# Patient Record
Sex: Male | Born: 1950 | Race: White | Hispanic: No | Marital: Married | Smoking: Never smoker
Health system: Southern US, Community
[De-identification: ages and names within clinical notes are randomized; demographics above are authoritative.]

## PROBLEM LIST (undated history)

## (undated) DIAGNOSIS — K7460 Unspecified cirrhosis of liver: Secondary | ICD-10-CM

## (undated) DIAGNOSIS — F419 Anxiety disorder, unspecified: Secondary | ICD-10-CM

---

## 2007-11-28 ENCOUNTER — Emergency Department (HOSPITAL_BASED_OUTPATIENT_CLINIC_OR_DEPARTMENT_OTHER): Admission: EM | Admit: 2007-11-28 | Discharge: 2007-11-28 | Payer: Self-pay | Admitting: Emergency Medicine

## 2017-12-16 ENCOUNTER — Other Ambulatory Visit: Payer: Self-pay | Admitting: Gastroenterology

## 2017-12-16 DIAGNOSIS — K7031 Alcoholic cirrhosis of liver with ascites: Secondary | ICD-10-CM

## 2017-12-17 ENCOUNTER — Ambulatory Visit
Admission: RE | Admit: 2017-12-17 | Discharge: 2017-12-17 | Disposition: A | Discharge status: Home / Self Care | Payer: Self-pay | Source: Ambulatory Visit | Attending: Gastroenterology | Admitting: Gastroenterology

## 2017-12-17 ENCOUNTER — Other Ambulatory Visit: Payer: Self-pay | Admitting: *Deleted

## 2017-12-17 DIAGNOSIS — K7031 Alcoholic cirrhosis of liver with ascites: Secondary | ICD-10-CM

## 2017-12-17 HISTORY — PX: IR RADIOLOGIST EVAL & MGMT: IMG5224

## 2017-12-17 NOTE — Consult Note (Signed)
Chief Complaint: Patient was seen in consultation today for  Chief Complaint  Patient presents with  . Consult    Consult for TIPS    at the request of Badreddine,Rami  Referring Physician(s): Badreddine,Rami  History of Present Illness: Jimmy Sanford is a 67 y.o. 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  In April he was being evaluated for increasing abdominal girth over the previous 6 months.  CT demonstrated ascites and morphologic changes of liver cirrhosis.  He does have a multiyear history of alcohol heavy use.  He also complained at that time of mild bilateral lower extremity swelling abdominal pressure.  No melena, hematochezia, jaundice, weight loss, fever or chills.  Since then, he is undergone 6 large-volume ultrasound-guided paracentesis procedures for recurrent abdominal ascites.Endoscopy 09/07/17 showed no varices.    PAST MEDICAL HISTORY:    Patient Active Problem List  Diagnosis  . Allergic rhinitis  . Anxiety  . Benign essential hypertension  . Gastroesophageal reflux disease  . Testicular hypofunction  . Mild alcohol use disorder  . Impaired fasting glucose  . Tendinopathy of right rotator cuff  . Acute pain of right shoulder  . Hypokalemia  . Tendinopathy of left rotator cuff  . Bicipital tendinitis of left shoulder  . Organic impotence  . DOE (dyspnea on exertion)  . Other fatigue  . Decrease in appetite  . Acute kidney failure ([place])  . Abdominal distension  . Anemia    PAST SURGICAL HISTORY: PastSurgicalHistory  Past Surgical History:  Procedure Laterality Date  . ANKLE FRACTURE SURGERY          Allergies: NKDA  Medications: Prior to Admission medications   Medication Sig Start Date End Date Taking? Authorizing Provider  citalopram (CELEXA) 20 MG tablet Take 20 mg by mouth daily.   Yes  [provider]  ergocalciferol (VITAMIN D2) 50000 units capsule Take 50,000 Units by mouth once a week.   Yes [provider]  folic acid (FOLVITE) 1 MG tablet Take 1 mg by mouth daily.   Yes [provider]  lactulose (CHRONULAC) 10 GM/15ML solution Take by mouth daily.   Yes [provider]  milk thistle 175 MG tablet Take 175 mg by mouth daily.   Yes [provider]  pantoprazole (PROTONIX) 40 MG tablet Take 40 mg by mouth daily.   Yes [provider]  sildenafil (REVATIO) 20 MG tablet Take 20 mg by mouth 3 (three) times daily.   Yes [provider]  spironolactone (ALDACTONE) 50 MG tablet Take 50 mg by mouth daily.   Yes [provider]  Testosterone 30 MG/ACT SOLN Place 1 Pump onto the skin daily.   Yes [provider]  traZODone (DESYREL) 50 MG tablet Take 50 mg by mouth at bedtime.   Yes [provider]       FAMILY HISTORY: Negative for colon cancer, colon polyps, ulcerative colitis, Crohn's disease, liver disease.   Social History   Socioeconomic History  . Marital status: Married    Spouse name: Not on file  . Number of children: Not on file  . Years of education: Not on file  . Highest education level: Not on file  Occupational History  . Not on file  Social Needs  . Financial resource strain: Not on file  . Food insecurity:    Worry: Not on file  Inability: Not on file  . Transportation needs:    Medical: Not on file    Non-medical: Not on file  Tobacco Use  . Smoking status: Never Smoker  . Smokeless tobacco: Former Systems developer    Types: Chew  Substance and Sexual Activity  . Alcohol use: Not Currently  . Drug use: Never  . Sexual activity: Not on file  Lifestyle  . Physical activity:    Days per week: Not on file    Minutes per session: Not on file  . Stress: Not on file  Relationships  . Social connections:    Talks on phone: Not on file    Gets together: Not on  file    Attends religious service: Not on file    Active member of club or organization: Not on file    Attends meetings of clubs or organizations: Not on file    Relationship status: Not on file  Other Topics Concern  . Not on file  Social History Narrative  . Not on file    ECOG Status: 1 - Symptomatic but completely ambulatory   Review of Systems  Review of Systems: A 12 point ROS discussed and pertinent positives are indicated in the HPI above.  All other systems are negative.  Physical Exam Vital Signs: BP 133/88   Pulse 78   Temp 98 F (36.7 C) (Oral)   Resp 15   Ht 6' (1.829 m)   Wt 93 kg   SpO2 98%   BMI 27.80 kg/m   Constitutional: Oriented to person, place, and time. Well-developed and well-nourished. No distress.  Last Weight  Most recent update: 12/17/2017  2:17 PM   Weight  93 kg (205 lb)           HENT:  Head: Normocephalic and atraumatic.  Eyes: Conjunctivae and EOM are normal. Right eye exhibits no discharge. Left eye exhibits no discharge. No scleral icterus.  Neck: No JVD present.  Pulmonary/Chest: Effort normal. No stridor. No respiratory distress.  Abdomen: soft, non distended Neurological:  alert and oriented to person, place, and time.  Skin: Skin is warm and dry.  not diaphoretic.  Psychiatric:   normal mood and affect.   behavior is normal. Judgment and thought content normal.        Imaging:  CT CHEST, ABDOMEN AND PELVIS WITHOUT CONTRAST  TECHNIQUE: Multidetector CT imaging of the chest, abdomen and pelvis was performed following the standard protocol without IV contrast. The patient could not have IV contrast due to renal insufficiency.  COMPARISON:  Radiographs from 08/24/2017  FINDINGS: CT CHEST FINDINGS  Cardiovascular: Mild atherosclerotic calcification at the origin of the left subclavian artery.  Mediastinum/Nodes: Dilated esophagus is contrast filled, query dysmotility or reflux. Minimal distal esophageal wall  thickening. No pathologic adenopathy is observed.  Lungs/Pleura: Elevated right hemidiaphragm with atelectasis primarily involving the right middle lobe accounting for much of the right basilar irregularity. There is also some atelectasis in the right lower lobe, as shown on image 56/4. No appreciable airway obstructing process. No pulmonary abscess or compelling findings of pneumonia.  5 by 3 mm calcified nodule in the left lower lobe compatible with granuloma, image 77/3.  Musculoskeletal: Lower thoracic spondylosis. Mild bilateral gynecomastia.  CT ABDOMEN PELVIS FINDINGS  Hepatobiliary: Somewhat small liver, equivocal marginal irregularity, correlate with history of cirrhosis. No appreciable gallstones.  Pancreas: Unremarkable  Spleen: The spleen measures 13.2 by 7.0 by 15.9 cm (volume = 770 cm^3). There are some linear calcifications along the  upper splenic capsule.  Adrenals/Urinary Tract: Adrenal glands normal. 2.7 by 2.8 cm right kidney upper pole hypodense lesion, fluid density. 1.6 by 1.7 cm right kidney lower pole hypodense lesion, fluid density. 1.0 cm hypodense lesion of the left kidney upper pole. Small exophytic lesion of the left kidney upper pole is hyperdense and probably a cyst but technically nonspecific. No stones identified.  Stomach/Bowel: No dilated bowel identified. Fat deposition in the wall of the ascending colon which can be weakly associated with inflammatory bowel disease but which is more commonly simply an incidental finding. No significant amount of stool in the colon. Scattered sigmoid colon diverticula. Jejunal folds normal, no abnormal small bowel dilatation. Appendix not well seen.  Vascular/Lymphatic: Aortoiliac atherosclerotic vascular disease. No pathologic adenopathy observed  Reproductive: Curvilinear calcifications centrally along the prostate gland.  Other: Marked ascites. Fluid infiltration of the omentum  and mesentery.  Musculoskeletal: Umbilical hernia contains ascites and measures about 5 cm in diameter. Subcutaneous edema along the anterior abdominal wall and to a lesser degree overlying the hips and pubis. Fatty left spermatic cord. Bridging spurring of the sacroiliac joints. Degenerative facet arthropathy bilaterally at L4-5 and L5-S1  IMPRESSION: 1. The right basilar opacity represents atelectasis in the right middle lobe and right lower lobe associated with an elevated right hemidiaphragm. No abscess or pneumonia in the right lung is observed. 2. Extensive ascites, with fluid infiltration of the omentum and mesentery. I do not see obvious tumor masses, although IV contrast could not be administered on today's exam which reduces diagnostic sensitivity. Correlate with potential causes for ascites. Does the patient have hypoproteinemia/hypoalbuminemia? 3. The liver is somewhat small and slightly irregular, query history of cirrhosis. There is mild to moderate splenomegaly as well. Aortoiliac atherosclerotic vascular disease. 4. Umbilical hernia contains ascites. 5. Subcutaneous edema along the anterior abdominal wall. 6. Sigmoid colon diverticulosis. 7. Bilateral fluid density renal lesions favoring cysts.   Electronically Signed   By: Van Clines M.D.   On: 08/25/2017 13:29   Labs:   Component Results   Component Value Ref Range & Units Status Lab  WBC 7.0  4.8 - 10.8 x 10*3/uL Final [facility]  RBC 4.45   4.70 - 6.10 x 10*6/uL Final [facility]  Hemoglobin 13.8   14.0 - 18.0 G/DL Final [facility]  Hematocrit 39.7   42.0 - 52.0 % Final [facility]  MCV 89.2  80.0 - 94.0 FL Final [facility]  MCH 30.9  27.0 - 31.0 PG Final [facility]  MCHC 34.7  33.0 - 37.0 G/DL Final [facility]  RDW 13.8  11.5 - 14.5 % Final [facility]  Platelets 163  160 - 360 X 10*3/uL Final [facility]  MPV 7.3  6.8 - 10.2 FL Final [facility]  [facility]  Neutrophil % 74  % Final [facility]  Lymphocyte % 18  % Final [facility]  Monocyte % 7  % Final [facility]  Eosinophil % 2  % Final [facility]  Basophil % 0  % Final [facility]  Neutrophil Absolute 5.1  1.6 - 7.3 x 10*3/uL Final [facility]  Lymphocyte Absolute 1.3  1.0 - 5.1 x 10*3/uL Final [facility]  Monocyte Absolute 0.5  0.2 - 0.9 x 10*3/uL Final [facility]  Eosinophil Absolute 0.1  0.0 - 0.5 x 10*3/uL Final [facility]  Basophil Absolute 0.0  0.0 - 0.2 x 10*3/uL Final [facility]    Component Results   Component Value Ref Range & Units  Status Lab  Prothrombin Time 10.2  8.9 - 12.1 SEC Final [facility]  INR 0.96  <5.00 Final [facility]  Testing Performed By   Lab - [place] Name Director Address Valid Date Range  [phone] [facility] [place]. [place], MD CLIA# [id number] [place] [address] 12/08/14 1944-Present    Component Results   Component Value Ref Range & Units Status Lab  Sodium 138  135 - 146 MMOL/L Final [facility]  Potassium 5.2  3.5 - 5.3 MMOL/L Final [facility]  Chloride 103  98 - 110 MMOL/L Final [facility]  CO2 28  23 - 30 MMOL/L Final [facility]  BUN 34   8 - 24 MG/DL Final [facility]  Glucose 106   70 - 99 MG/DL Final [facility]  Creatinine 1.96   0.50 - 1.50 MG/DL Final [facility]  Calcium 10.3  8.5 - 10.5 MG/DL Final [facility]  Total Protein 6.4  6.0 - 8.3 G/DL Final [facility]  Albumin  3.8  3.5 - 5.0 G/DL Final [facility]  Total Bilirubin 0.7  0.1 - 1.2 MG/DL Final [facility]  Alkaline Phosphatase 53  25 - 125 IU/L Final [facility]  AST (SGOT) 15  5 - 40 IU/L Final [facility]  ALT (SGPT) 10  5 - 50 IU/L Final [facility]  Anion Gap 7  4 - 14 MMOL/L Final [facility]  Est. GFR Non-African American 35   >=60 ML/MIN/1.73 M*2       Component Value Ref Range & Units  Status Lab  Hepatitis B Surface Antibody, QL   Final [facility]  NONREACTIVE (EQUIVALENT TO <10 mIU/ml)   HEP-B Core AB Non-Reactive  Non-Reactive Final [facility]  Hepatitis C Antibody Non-Reactive  Non-Reactive Final [facility]  Hepatitis A (IGG and IGM) Antibody Non-Reactive  Non-Reactive Final [facility]  Hepatitis B Surface Antigen Non-Reactive  Non-Reactive Final [facility]   Component Results   Component Value Ref Range & Units Status Lab  Ammonia 37  6 - 47 UMOL/L Final [facility]    Assessment and Plan:  My impression is that this patient has recurrent large volume ascites secondary to his Child Pugh class B cirrhosis and subsequent portal venous hypertension.  MELD=13.  He would be  an appropriate candidate for consideration of elective TIPS for ascites control. We reviewed the pathophysiology of cirrhosis, portal venous hypertension, and recurrent abdominal ascites.  We reviewed the role of TIPS in decreasing the portal venous hypertension and subsequent ascites.  Reviewed the filtration function of the liver, risk of hepatic encephalopathy, and use of lactulose.  We discussed the variable effect on hepatic function post TIPS.  We discussed the irreversibility of hepatic cirrhosis, that TIPS is not a cure, and that only liver transplant is curative, and presence of a TIPS stent is not a contraindication.  He has been in discussion with [facility] about liver transplantation.  He seemed to understand, and did ask appropriate questions.  He is motivated to proceed.  To completed work his work-up, would require an ultrasound hepatic Doppler evaluation to confirm portal vein and hepatic vein patency.  Assuming this looks okay, we can set him up electively at [facility] for elective paracentesis and TIPS under general anesthesia, with plans for overnight observation.  Thank you for this interesting consult.  I greatly enjoyed meeting Kaelon Weekes and look forward to participating in their care.  A copy of  this report  was sent to the requesting provider on this date.  Electronically Signed: Rickard Rhymes 12/17/2017, 2:57 PM   I spent a total of  40 Minutes   in face to face in clinical consultation, greater than 50% of which was counseling/coordinating care for large volume recurrent abdominal ascites in the setting of cirrhosis.

## 2017-12-24 ENCOUNTER — Encounter: Payer: Self-pay | Admitting: *Deleted

## 2018-01-20 ENCOUNTER — Other Ambulatory Visit: Payer: Self-pay | Admitting: Physician Assistant

## 2018-01-20 DIAGNOSIS — C220 Liver cell carcinoma: Secondary | ICD-10-CM

## 2018-01-25 ENCOUNTER — Other Ambulatory Visit (HOSPITAL_COMMUNITY): Payer: Self-pay | Admitting: Interventional Radiology

## 2018-02-01 ENCOUNTER — Other Ambulatory Visit (HOSPITAL_COMMUNITY): Payer: Self-pay | Admitting: Interventional Radiology

## 2018-02-01 DIAGNOSIS — C220 Liver cell carcinoma: Secondary | ICD-10-CM

## 2018-02-04 ENCOUNTER — Other Ambulatory Visit: Payer: Self-pay | Admitting: Radiology

## 2018-02-18 ENCOUNTER — Ambulatory Visit
Admission: RE | Admit: 2018-02-18 | Discharge: 2018-02-18 | Disposition: A | Discharge status: Home / Self Care | Payer: PRIVATE HEALTH INSURANCE | Source: Ambulatory Visit | Attending: Physician Assistant | Admitting: Physician Assistant

## 2018-02-18 ENCOUNTER — Ambulatory Visit
Admission: RE | Admit: 2018-02-18 | Discharge: 2018-02-18 | Disposition: A | Discharge status: Home / Self Care | Payer: PRIVATE HEALTH INSURANCE | Source: Ambulatory Visit | Attending: Interventional Radiology | Admitting: Interventional Radiology

## 2018-02-18 ENCOUNTER — Encounter: Payer: Self-pay | Admitting: *Deleted

## 2018-02-18 DIAGNOSIS — C220 Liver cell carcinoma: Secondary | ICD-10-CM

## 2018-02-18 HISTORY — PX: IR RADIOLOGIST EVAL & MGMT: IMG5224

## 2018-02-18 NOTE — Progress Notes (Signed)
Patient ID: Jimmy Sanford, male   DOB: 1951-04-22, 67 y.o.   MRN: [id number]       Chief Complaint: Patient was seen in consultation today for  Chief Complaint  Patient presents with  . Follow-up    1 mo follow up TIPS     at the request of Ardis Rowan  Referring Physician(s): Badreddine  History of Present Illness: Jimmy Sanford is a 67 y.o. male who returns for a scheduled follow-up appointment 1 month post TIPS creation 01/09/2018 for large volume abdominal ascites in the setting of hepatic cirrhosis.  In April he was being evaluated for increasing abdominal girth over the previous 6 months.  CT demonstrated ascites and morphologic changes of liver cirrhosis.  He does have a multiyear history of alcohol heavy use.  He also complained at that time of mild bilateral lower extremity swelling abdominal pressure.  No melena, hematochezia, jaundice, weight loss, fever or chills.  Since then, he is undergone 6 large-volume ultrasound-guided paracentesis procedures for recurrent abdominal ascites.Endoscopy 09/07/17 showed no varices.  He previously contained multiple large volume paracentesis procedures generally removing 10-12 L.  He has done well post procedure.  He has only needed 1 post procedure paracentesis on 02/10/2018, removing only 5 L.  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  He endorses no symptoms of hepatic encephalopathy.  He tried the lactulose but currently is not using it because he finds the diarrhea side effect intolerable.  He is staying off alcohol, eating healthy diet, trying to rebuild muscle mass and optimize his health status.  Recent calculated MELD of 10, currently inactive on the liver transplant evaluation.  Doppler ultrasound today shows good patency and flow through the TIPS shunts.  Hepatic vascular evaluation otherwise unremarkable.  Moderate ascites.  He is interested in getting his umbilical hernia repaired.   No past medical history on  file.    Allergies: Patient has no allergy information on record.  Medications: Prior to Admission medications   Medication Sig Start Date End Date Taking? Authorizing Provider  citalopram (CELEXA) 20 MG tablet Take 20 mg by mouth daily.   Yes [provider]  ergocalciferol (VITAMIN D2) 50000 units capsule Take 50,000 Units by mouth once a week.   Yes [provider]  folic acid (FOLVITE) 1 MG tablet Take 1 mg by mouth daily.   Yes [provider]  milk thistle 175 MG tablet Take 175 mg by mouth daily.   Yes [provider]  pantoprazole (PROTONIX) 40 MG tablet Take 40 mg by mouth daily.   Yes [provider]  sildenafil (REVATIO) 20 MG tablet Take 20 mg by mouth 3 (three) times daily.   Yes [provider]  spironolactone (ALDACTONE) 50 MG tablet Take 50 mg by mouth daily.   Yes [provider]  Testosterone 30 MG/ACT SOLN Place 1 Pump onto the skin daily.   Yes [provider]  traZODone (DESYREL) 50 MG tablet Take 50 mg by mouth at bedtime.   Yes [provider]  lactulose (CHRONULAC) 10 GM/15ML solution Take by mouth daily.    [provider]     No family history on file.  Social History   Socioeconomic History  . Marital status: Married    Spouse name: Not on file  . Number of children: Not on file  . Years of education: Not on file  . Highest education level: Not on file  Occupational History  . Not  on file  Social Needs  . Financial resource strain: Not on file  . Food insecurity:    Worry: Not on file    Inability: Not on file  . Transportation needs:    Medical: Not on file    Non-medical: Not on file  Tobacco Use  . Smoking status: Never Smoker  . Smokeless tobacco: Former Systems developer    Types: Chew  Substance and Sexual Activity  . Alcohol use: Not Currently  . Drug use: Never  . Sexual activity: Not on file  Lifestyle  . Physical activity:    Days per week: Not on file      Minutes per session: Not on file  . Stress: Not on file  Relationships  . Social connections:    Talks on phone: Not on file    Gets together: Not on file    Attends religious service: Not on file    Active member of club or organization: Not on file    Attends meetings of clubs or organizations: Not on file    Relationship status: Not on file  Other Topics Concern  . Not on file  Social History Narrative  . Not on file    ECOG Status: 1 - Symptomatic but completely ambulatory  Review of Systems: A 12 point ROS discussed and pertinent positives are indicated in the HPI above.  All other systems are negative.  Review of Systems  Vital Signs: BP 112/76   Pulse 64   Temp (!) 97.5 F (36.4 C) (Oral)   Resp 15   Ht 6' (1.829 m)   Wt 93 kg   SpO2 100%   BMI 27.80 kg/m   Physical Exam Constitutional: Oriented to person, place, and time. Well-developed and well-nourished. No distress.  Last Weight  Most recent update: 02/18/2018 10:55 AM   Weight  93 kg (205 lb)           HENT:  Head: Normocephalic and atraumatic.  Eyes: Conjunctivae and EOM are normal. Right eye exhibits no discharge. Left eye exhibits no discharge. No scleral icterus.  Neck: No JVD present.  Right IJ entry site well-healed. Pulmonary/Chest: Effort normal. No stridor. No respiratory distress.  Abdomen: soft, non distended Neurological:  alert and oriented to person, place, and time.  No asterixis Skin: Skin is warm and dry.  not diaphoretic.  Psychiatric:   normal mood and affect.   behavior is normal. Judgment and thought content normal.   Mallampati Score:     Imaging: EXAM: 1. TIPS CREATION 2. ULTRASOUND-GUIDED VENOUS ACCESS 3. PARACENTESIS WITH ULTRASOUND GUIDANCE  ANESTHESIA/SEDATION: General - as administered by the Anesthesia department  MEDICATIONS: Rocephin 2 g IV within 1 hour of procedure start  FLUOROSCOPY TIME: 15.8 minute; 989 mGy  COMPLICATIONS: None  immediate.  PROCEDURE: Informed written consent was obtained from the patient after a thorough discussion of the procedural risks, benefits and alternatives. All questions were addressed. See previous consultation. Maximal Sterile Barrier Technique was utilized including caps, mask, sterile gowns, sterile gloves, sterile drape, hand hygiene and skin antiseptic. A timeout was performed prior to the initiation of the procedure.  The skin overlying the right upper abdominal quadrant as well as the right neck and femoral region were prepped and draped in usual sterile fashion. Initial ultrasound scanning demonstrates a moderate amount of recurrent ascites.  Under ultrasound guidance, a 6 [place] Safe-T-Centesis needle was advanced into the peritoneal cavity from a right lateral approach for paracentesis removing 7L amber ascites.  Next,  the right internal jugular vein was accessed under direct ultrasound. Ultrasound image was saved for procedural documentation purposes. This allowed for placement of the 10 French TIPS vascular sheath.  With the aid of angiographic guidewires, an MPA catheter was utilized to select the right hepatic vein and a hepatic venogram was performed. Sheath advanced into the right hepatic vein.  Under direct ultrasound guidance, a peripheral aspect of the right portal vein was accessed with a 21 gauge needle. Ultrasound image was saved for procedural documentation purposes. Guidewire advanced into the main portal vein. Micropuncture transitional dilator was advanced over the guidewire, with the radiopaque transition of the 018 wire positioned at the target entrance to the posterior right portal vein.  Under fluoroscopic guidance, the right portal vein with targeted with a Rsch-Uchida TIPS needle directed at the radiopaque target within the right portal vein. Ultimately, the right portal vein was accessed at a desirable location allowing advancement of a  stiff Glidewire into the main portal vein. A 4 French angiographic catheter was advanced over the stiff Glidewire and a portal venogram was performed. Pressure measurements were obtained: Right atrium mean 12 mmHg, portal vein 22 mmHg mean.  Next, the track was dilated with a 7 mm angioplasty balloon using a stiff Amplatz guidewire, allowing advancement of the sheath into the right portal vein. A portal venogram was performed through the sheath. The parenchymal tract was measured. Next, a 2 cm (un covered) x 8 cm (covered) x 10 mm diameter GORE VIATORR TIPS Endoprosthesis was advanced through the sheath across the intra hepatic track and deployed. The TIPS stent was angioplastied in multiple stations to 10 mm diameter.  Follow-up portal venogram demonstrated good stent deployment and flow, no residual stenosis. No extravasation. Continued antegrade flow through the portal venous system. No significant varices or collateral channels were identified. No blood from the paracentesis catheter.  All wires, catheters and sheaths were removed from the patient. Safe-T-Centesis catheter removed. Hemostasis was achieved at the right IJ access site with manual compression. A dressing was placed.  The patient tolerated the procedure well. Patient transferred to the PACU.  IMPRESSION: 1. Portal venous hypertension, with technically successful TIPS creation. 2. Ultrasound guided paracentesis removing 7 L amber ascites.   Electronically Signed By: Lucrezia Europe M.D. On: 01/19/2018 13:08   DUPLEX ULTRASOUND OF LIVER AND TIPS SHUNT  TECHNIQUE: Color and duplex Doppler ultrasound was performed to evaluate the hepatic in-flow and out-flow vessels.  COMPARISON: 12/25/2017 preop  FINDINGS: TIPS Shunt Velocities (all antegrade)  Proximal: 57 cm/sec  Mid: 84  Distal: 75-117 cm/sec  Portal Vein Velocities (all hepatopetal)  Main: 25 cm/sec  Right: 29 cm/sec  Left: 13  cm/sec  Hepatic Vein Velocities (all hepatofugal)  Right: 75 cm/sec  Middle: 28 cm/sec  Left: 835 cm/sec  Superior mesenteric vein: 43 cm/seconds  Hepatic Artery Velocity: 181 cm/seconds  Splenic Vein Velocity: 45 cm/seconds  Varices: None visualized  Ascites: Moderate  Other findings: None  IMPRESSION: 1. Widely patent TIPS stent with good velocities. 2. Recurrent abdominal ascites    Labs:  CBC: No results for input(s): WBC, HGB, HCT, PLT in the last 8760 hours.  COAGS: No results for input(s): INR, APTT in the last 8760 hours.  BMP: No results for input(s): NA, K, CL, CO2, GLUCOSE, BUN, CALCIUM, CREATININE, GFRNONAA, GFRAA in the last 8760 hours.  Invalid input(s): CMP  LIVER FUNCTION TESTS: No results for input(s): BILITOT, AST, ALT, ALKPHOS, PROT, ALBUMIN in the last 8760 hours.  TUMOR MARKERS:  No results for input(s): AFPTM, CEA, CA199, CHROMGRNA in the last 8760 hours.  Assessment and Plan:  My impression is that Mr. Marguerita Beards has done very well 1 month post TIPS.  I am not surprised that he has needed smaller volume paracentesis post procedure, hopefully the ascites will dry up over the next few months.  I think his abstaining from alcohol will help maximize his longevity and  hopefully limit progression of his hepatic cirrhosis.  I would anticipate durable function of the TIPS.  We will plan on routine annual surveillance ultrasound per protocol.  I did discuss the importance of using lactulose  especially in the event of any early indication of hepatic encephalopathy.  He understands.  He plans to follow-up with his PCP and general surgery regarding umbilical hernia repair.  Thank you for this interesting consult.  I greatly enjoyed meeting Alazar Cherian and look forward to participating in their care.  A copy of this report was sent to the requesting provider on this date.  Plan: Follow-up hepatic Doppler ultrasound and clinic visit in 1  year.  Electronically Signed: Rickard Rhymes 02/18/2018, 1:27 PM   I spent a total of    15 Minutes in face to face in clinical consultation, greater than 50% of which was counseling/coordinating care for cirrhosis with large volume recurrent ascites, now post TIPS.

## 2018-10-29 ENCOUNTER — Encounter (HOSPITAL_BASED_OUTPATIENT_CLINIC_OR_DEPARTMENT_OTHER): Payer: Self-pay | Admitting: Emergency Medicine

## 2018-10-29 ENCOUNTER — Other Ambulatory Visit: Payer: Self-pay

## 2018-10-29 ENCOUNTER — Emergency Department (HOSPITAL_BASED_OUTPATIENT_CLINIC_OR_DEPARTMENT_OTHER)
Admission: EM | Admit: 2018-10-29 | Discharge: 2018-10-29 | Disposition: A | Discharge status: Home / Self Care | Payer: Medicare Other | Attending: Emergency Medicine | Admitting: Emergency Medicine

## 2018-10-29 DIAGNOSIS — Y999 Unspecified external cause status: Secondary | ICD-10-CM | POA: Diagnosis not present

## 2018-10-29 DIAGNOSIS — Z79899 Other long term (current) drug therapy: Secondary | ICD-10-CM | POA: Insufficient documentation

## 2018-10-29 DIAGNOSIS — E669 Obesity, unspecified: Secondary | ICD-10-CM | POA: Diagnosis not present

## 2018-10-29 DIAGNOSIS — L03114 Cellulitis of left upper limb: Secondary | ICD-10-CM | POA: Insufficient documentation

## 2018-10-29 DIAGNOSIS — L03113 Cellulitis of right upper limb: Secondary | ICD-10-CM

## 2018-10-29 DIAGNOSIS — S51812A Laceration without foreign body of left forearm, initial encounter: Secondary | ICD-10-CM | POA: Diagnosis not present

## 2018-10-29 DIAGNOSIS — Y929 Unspecified place or not applicable: Secondary | ICD-10-CM | POA: Diagnosis not present

## 2018-10-29 DIAGNOSIS — W5503XA Scratched by cat, initial encounter: Secondary | ICD-10-CM | POA: Diagnosis not present

## 2018-10-29 DIAGNOSIS — S61432A Puncture wound without foreign body of left hand, initial encounter: Secondary | ICD-10-CM | POA: Diagnosis not present

## 2018-10-29 DIAGNOSIS — Y939 Activity, unspecified: Secondary | ICD-10-CM | POA: Diagnosis not present

## 2018-10-29 DIAGNOSIS — M7989 Other specified soft tissue disorders: Secondary | ICD-10-CM

## 2018-10-29 HISTORY — DX: Anxiety disorder, unspecified: F41.9

## 2018-10-29 HISTORY — DX: Unspecified cirrhosis of liver: K74.60

## 2018-10-29 MED ORDER — AMOXICILLIN-POT CLAVULANATE 875-125 MG PO TABS
1.0000 | ORAL_TABLET | Freq: Two times a day (BID) | ORAL | 0 refills | Status: AC
Start: 2018-10-29 — End: 2018-11-12

## 2018-10-29 MED ORDER — AMOXICILLIN-POT CLAVULANATE 875-125 MG PO TABS
1.0000 | ORAL_TABLET | Freq: Once | ORAL | Status: AC
  Administered 2018-10-29: 23:00:00 1 via ORAL
  Filled 2018-10-29: qty 1

## 2018-10-29 MED ORDER — HYDROXYZINE HCL 25 MG PO TABS
25.0000 mg | ORAL_TABLET | Freq: Once | ORAL | Status: AC
  Administered 2018-10-29: 25 mg via ORAL
  Filled 2018-10-29: qty 1

## 2018-10-29 NOTE — ED Triage Notes (Signed)
Pt c/o left hand swelling that began today. He mentions that his indoor/outdoor cat scratch/bite. Hand is swollen and red. Patient states hand itches and it looks as if it is spreading up his arm. Denies fevers, chills, nausea or vomiting.

## 2018-10-29 NOTE — Discharge Instructions (Addendum)
You were seen today for hand redness and swelling after being scratched by your cat. There was no evidence of abscess. Take the antibiotic prescribed until you run out. If you develop fevers or worsened redness or swelling, come back to be seen.  You also may be having an allergic type reaction to the scratch, and for the itching we recommend benadryl 25mg  as needed.

## 2018-10-29 NOTE — ED Provider Notes (Signed)
[facility] EMERGENCY DEPARTMENT Provider Note   CSN: [id number] Arrival date & time: 10/29/18  2127    History   Chief Complaint Chief Complaint  Patient presents with  . Hand Pain  . Animal Bite    HPI Jimmy Sanford is a 68 y.o. male with PMH significant for CKD, alcoholic cirrhosis s/p TIPS, HTN, GERD, anxiety, allergies here for progressive L hand and arm swelling and redness after being scratched by his cat earlier this afternoon. He was scratched on the dorsum of his forearm with few puncture wounds to dorsum of L hand. He cleaned the area and put neosporin on the area immediately after.  He denies N/V, fevers, activity change. His arm is not particularly painful except when he tries to make a fist.     Past Medical History:  Diagnosis Date  . Anxiety   . Cirrhosis ([place])     There are no active problems to display for this patient.   Past Surgical History:  Procedure Laterality Date  . IR RADIOLOGIST EVAL & MGMT  12/17/2017  . IR RADIOLOGIST EVAL & MGMT  02/18/2018     Home Medications    Prior to Admission medications   Medication Sig Start Date End Date Taking? Authorizing Provider  amoxicillin-clavulanate (AUGMENTIN) 875-125 MG tablet Take 1 tablet by mouth 2 (two) times daily for 14 days. 10/29/18 11/12/18  Rory Percy, DO  citalopram (CELEXA) 20 MG tablet Take 20 mg by mouth daily.    [provider]  ergocalciferol (VITAMIN D2) 50000 units capsule Take 50,000 Units by mouth once a week.    [provider]  folic acid (FOLVITE) 1 MG tablet Take 1 mg by mouth daily.    [provider]  lactulose (CHRONULAC) 10 GM/15ML solution Take by mouth daily.    [provider]  milk thistle 175 MG tablet Take 175 mg by mouth daily.    [provider]  pantoprazole (PROTONIX) 40 MG tablet Take 40 mg by mouth daily.    [provider]  sildenafil (REVATIO) 20 MG tablet Take 20 mg by mouth 3 (three) times daily.     [provider]  spironolactone (ALDACTONE) 50 MG tablet Take 50 mg by mouth daily.    [provider]  Testosterone 30 MG/ACT SOLN Place 1 Pump onto the skin daily.    [provider]  traZODone (DESYREL) 50 MG tablet Take 50 mg by mouth at bedtime.    [provider]    Family History No family history on file.  Social History Social History   Tobacco Use  . Smoking status: Never Smoker  . Smokeless tobacco: Former Systems developer    Types: Chew  Substance Use Topics  . Alcohol use: Not Currently  . Drug use: Never     Allergies   Patient has no known allergies.   Review of Systems Review of Systems  Constitutional: Negative for activity change and fever.  HENT: Negative for congestion and sore throat.   Respiratory: Negative for cough and shortness of breath.   Cardiovascular: Negative for chest pain.  Gastrointestinal: Negative for constipation, diarrhea, nausea and vomiting.  Genitourinary: Negative for difficulty urinating.  Skin: Positive for rash.  Psychiatric/Behavioral: Negative for confusion.     Physical Exam Updated Vital Signs BP 133/81   Pulse 69   Temp 98.1 F (36.7 C) (Oral)   Resp 18   Ht 6' (1.829 m)   Wt 111.1 kg   SpO2 96%  BMI 33.23 kg/m   Physical Exam Constitutional:      General: He is not in acute distress.    Appearance: He is obese. He is not ill-appearing, toxic-appearing or diaphoretic.  HENT:     Head: Normocephalic.  Cardiovascular:     Rate and Rhythm: Normal rate and regular rhythm.     Heart sounds: Normal heart sounds. No murmur.  Pulmonary:     Effort: Pulmonary effort is normal. No respiratory distress.     Breath sounds: Normal breath sounds.  Abdominal:     General: Bowel sounds are normal.     Palpations: Abdomen is soft.  Skin:    Capillary Refill: Capillary refill takes less than 2 seconds.     Comments: Two 1-2 mm open lesions on dorsum of L forearm with surrounding erythema, no  discharge or bleeding. 2 puncture wounds to dorsum of L hand. Diffuse swelling and erythema to dorsum of L hand extending to elbow. No axillary lymphadenopathy. No areas of fluctuance.  Neurological:     General: No focal deficit present.     Mental Status: He is alert and oriented to person, place, and time.    ED Treatments / Results  Labs (all labs ordered are listed, but only abnormal results are displayed) Labs Reviewed - No data to display  EKG None  Radiology No results found.  Procedures Procedures (including critical care time)  Medications Ordered in ED Medications  amoxicillin-clavulanate (AUGMENTIN) 875-125 MG per tablet 1 tablet (1 tablet Oral Given 10/29/18 2243)  hydrOXYzine (ATARAX/VISTARIL) tablet 25 mg (25 mg Oral Given 10/29/18 2243)     Initial Impression / Assessment and Plan / ED Course  I have reviewed the triage vital signs and the nursing notes.  Pertinent labs & imaging results that were available during my care of the patient were reviewed by me and considered in my medical decision making (see chart for details).   68yo M w/ h/o KD, alcoholic cirrhosis s/p TIPS, HTN, GERD, anxiety, allergies here with progressive L had swelling and redness after cat scratch earlier today. Well appearing and afebrile. No lymphadenopathy or abscess appreciated. Rx given for PO abx with strict return precautions.     Final Clinical Impressions(s) / ED Diagnoses   Final diagnoses:  Cat scratch  Swelling of left hand  Cellulitis of right upper extremity    ED Discharge Orders         Ordered    amoxicillin-clavulanate (AUGMENTIN) 875-125 MG tablet  2 times daily     10/29/18 2159           Rory Percy, DO 10/30/18 Wonda Amis, MD 10/30/18 1535

## 2019-01-14 ENCOUNTER — Emergency Department (HOSPITAL_BASED_OUTPATIENT_CLINIC_OR_DEPARTMENT_OTHER)
Admission: EM | Admit: 2019-01-14 | Discharge: 2019-01-14 | Disposition: A | Discharge status: Home / Self Care | Payer: Medicare Other | Attending: Emergency Medicine | Admitting: Emergency Medicine

## 2019-01-14 ENCOUNTER — Encounter (HOSPITAL_BASED_OUTPATIENT_CLINIC_OR_DEPARTMENT_OTHER): Payer: Self-pay

## 2019-01-14 ENCOUNTER — Emergency Department (HOSPITAL_BASED_OUTPATIENT_CLINIC_OR_DEPARTMENT_OTHER): Payer: Medicare Other

## 2019-01-14 ENCOUNTER — Other Ambulatory Visit: Payer: Self-pay

## 2019-01-14 DIAGNOSIS — Z79899 Other long term (current) drug therapy: Secondary | ICD-10-CM | POA: Insufficient documentation

## 2019-01-14 DIAGNOSIS — S61011A Laceration without foreign body of right thumb without damage to nail, initial encounter: Secondary | ICD-10-CM

## 2019-01-14 DIAGNOSIS — Y929 Unspecified place or not applicable: Secondary | ICD-10-CM | POA: Insufficient documentation

## 2019-01-14 DIAGNOSIS — Z87891 Personal history of nicotine dependence: Secondary | ICD-10-CM | POA: Insufficient documentation

## 2019-01-14 DIAGNOSIS — S6991XA Unspecified injury of right wrist, hand and finger(s), initial encounter: Secondary | ICD-10-CM | POA: Diagnosis present

## 2019-01-14 DIAGNOSIS — W312XXA Contact with powered woodworking and forming machines, initial encounter: Secondary | ICD-10-CM | POA: Insufficient documentation

## 2019-01-14 DIAGNOSIS — Y939 Activity, unspecified: Secondary | ICD-10-CM | POA: Diagnosis not present

## 2019-01-14 DIAGNOSIS — Z23 Encounter for immunization: Secondary | ICD-10-CM | POA: Diagnosis not present

## 2019-01-14 DIAGNOSIS — S62521B Displaced fracture of distal phalanx of right thumb, initial encounter for open fracture: Secondary | ICD-10-CM | POA: Insufficient documentation

## 2019-01-14 MED ORDER — TETANUS-DIPHTH-ACELL PERTUSSIS 5-2.5-18.5 LF-MCG/0.5 IM SUSP
0.5000 mL | Freq: Once | INTRAMUSCULAR | Status: AC
  Administered 2019-01-14: 0.5 mL via INTRAMUSCULAR
  Filled 2019-01-14: qty 0.5

## 2019-01-14 MED ORDER — CEFAZOLIN SODIUM-DEXTROSE 1-4 GM/50ML-% IV SOLN
1.0000 g | Freq: Once | INTRAVENOUS | Status: AC
  Administered 2019-01-14: 17:00:00 1 g via INTRAVENOUS
  Filled 2019-01-14: qty 50

## 2019-01-14 MED ORDER — LIDOCAINE HCL (PF) 1 % IJ SOLN
10.0000 mL | Freq: Once | INTRAMUSCULAR | Status: DC
  Filled 2019-01-14: qty 10

## 2019-01-14 MED ORDER — CEPHALEXIN 500 MG PO CAPS: 500.0000 mg | ORAL_CAPSULE | Freq: Four times a day (QID) | ORAL | 0 refills | Status: AC

## 2019-01-14 NOTE — ED Provider Notes (Signed)
[facility] EMERGENCY DEPARTMENT Provider Note   CSN: [id number] Arrival date & time: 01/14/19  1550     History   Chief Complaint Chief Complaint  Patient presents with  . Finger Injury    HPI Jimmy Sanford is a 68 y.o. male.     Jimmy Sanford is a 68 y.o. male with a history of anxiety and cirrhosis, who presents to the ED for evaluation of finger injury.  Patient reports about 30 minutes prior to arrival he cut his right palm on a table saw.  He reports he initially called EMS and a dressing was applied but he refused transport and drove himself to the emergency department.  He reports that bleeding thus far has been controlled with EMS dressing.  Reports there is a jagged laceration through the finger pad of his right palm as well as a scraped area over the dorsal surface of the palm.  He denies any noted injury to the nail.  Denies numbness or tingling.  Reports mild pain over the area of the laceration.  He is able to bend and extend the finger.  Nothing for pain prior to arrival.  He is unsure of last tetanus vaccination.  Not on any blood thinners.  No other aggravating or alleviating factors.     Past Medical History:  Diagnosis Date  . Anxiety   . Cirrhosis ([place])     There are no active problems to display for this patient.   Past Surgical History:  Procedure Laterality Date  . IR RADIOLOGIST EVAL & MGMT  12/17/2017  . IR RADIOLOGIST EVAL & MGMT  02/18/2018        Home Medications    Prior to Admission medications   Medication Sig Start Date End Date Taking? Authorizing Provider  cephALEXin (KEFLEX) 500 MG capsule Take 1 capsule (500 mg total) by mouth 4 (four) times daily. 01/14/19   Jacqlyn Larsen, PA-C  citalopram (CELEXA) 20 MG tablet Take 20 mg by mouth daily.    [provider]  ergocalciferol (VITAMIN D2) 50000 units capsule Take 50,000 Units by mouth once a week.    [provider]  folic acid (FOLVITE) 1 MG tablet Take 1 mg by  mouth daily.    [provider]  lactulose (CHRONULAC) 10 GM/15ML solution Take by mouth daily.    [provider]  milk thistle 175 MG tablet Take 175 mg by mouth daily.    [provider]  pantoprazole (PROTONIX) 40 MG tablet Take 40 mg by mouth daily.    [provider]  sildenafil (REVATIO) 20 MG tablet Take 20 mg by mouth 3 (three) times daily.    [provider]  spironolactone (ALDACTONE) 50 MG tablet Take 50 mg by mouth daily.    [provider]  Testosterone 30 MG/ACT SOLN Place 1 Pump onto the skin daily.    [provider]  traZODone (DESYREL) 50 MG tablet Take 50 mg by mouth at bedtime.    [provider]    Family History No family history on file.  Social History Social History   Tobacco Use  . Smoking status: Never Smoker  . Smokeless tobacco: Former Systems developer    Types: Chew  Substance Use Topics  . Alcohol use: Not Currently  . Drug use: Never     Allergies   Patient has no known allergies.   Review of Systems Review of Systems  Constitutional: Negative for chills and fatigue.  Musculoskeletal: Positive for  arthralgias.  Skin: Positive for wound. Negative for color change and rash.  Neurological: Negative for weakness and numbness.     Physical Exam Updated Vital Signs BP (!) 143/104 (BP Location: Left Arm)   Pulse (!) 105   Temp 98.5 F (36.9 C) (Oral)   Resp 18   Ht 6' (1.829 m)   Wt 123.4 kg   SpO2 99%   BMI 36.89 kg/m   Physical Exam Vitals signs and nursing note reviewed.  Constitutional:      General: He is not in acute distress.    Appearance: Normal appearance. He is well-developed and normal weight. He is not diaphoretic.  HENT:     Head: Normocephalic and atraumatic.  Eyes:     General:        Right eye: No discharge.        Left eye: No discharge.  Pulmonary:     Effort: Pulmonary effort is normal. No respiratory distress.  Musculoskeletal:     Comments:  (Please see photo below).  Jagged lacerations through the digit pad of the right thumb, areas of skin missing, some mild bleeding.  No damage to the nail.  Normal flexion and extension at the IP joint and MCP.  On the dorsal surface of the thumb there is also a skin abrasion but no larger laceration requiring repair.  Normal sensation bilaterally.  2+ radial pulse and good capillary refill.  Neurological:     Mental Status: He is alert.     Coordination: Coordination normal.  Psychiatric:        Mood and Affect: Mood normal.        Behavior: Behavior normal.            ED Treatments / Results  Labs (all labs ordered are listed, but only abnormal results are displayed) Labs Reviewed - No data to display  EKG None  Radiology Dg Finger Thumb Right  Result Date: 01/14/2019 CLINICAL DATA:  Right thumb laceration. EXAM: RIGHT THUMB 2+V COMPARISON:  None. FINDINGS: Mildly displaced and comminuted fracture is seen involving the distal portion of the first distal phalanx. Overlying soft tissue irregularity is noted consistent with laceration. No other bony abnormality is noted. No radiopaque foreign body is noted. Joint spaces are intact. IMPRESSION: Mildly displaced and comminuted fracture is seen involving the distal portion of the first distal phalanx with associated soft tissue laceration. Electronically Signed   By: Marijo Conception M.D.   On: 01/14/2019 16:37    Procedures .Marland KitchenLaceration Repair  Date/Time: 01/14/2019 8:32 PM Performed by: Jacqlyn Larsen, PA-C Authorized by: Jacqlyn Larsen, PA-C   Consent:    Consent obtained:  Verbal   Consent given by:  Patient   Risks discussed:  Infection, pain, poor cosmetic result, poor wound healing and need for additional repair   Alternatives discussed:  No treatment Anesthesia (see MAR for exact dosages):    Anesthesia method:  Nerve block   Block needle gauge:  25 G   Block anesthetic:  Lidocaine 1% w/o epi   Block injection  procedure:  Anatomic landmarks identified, introduced needle and incremental injection Laceration details:    Location:  Finger   Finger location:  R thumb   Length (cm):  4 Repair type:    Repair type:  Intermediate Pre-procedure details:    Preparation:  Patient was prepped and draped in usual sterile fashion and imaging obtained to evaluate for foreign bodies Exploration:    Hemostasis achieved with:  Direct  pressure   Wound extent: underlying fracture   Treatment:    Area cleansed with:  Saline   Amount of cleaning:  Extensive   Irrigation solution:  Sterile saline   Irrigation volume:  103ml Skin repair:    Repair method:  Sutures   Suture size:  4-0   Wound skin closure material used: vicryl rapide.   Suture technique:  Simple interrupted   Number of sutures:  11 Approximation:    Approximation:  Loose Post-procedure details:    Dressing:  Bulky dressing   Patient tolerance of procedure:  Tolerated well, no immediate complications Comments:     Digit pad with multiple jagged lacerations, tissue macerated with multiple chunks of skin missing, wound was approximated as best possible to help control bleeding and pressure dressing was placed with good hemostasis   (including critical care time)  Medications Ordered in ED Medications  lidocaine (PF) (XYLOCAINE) 1 % injection 10 mL (has no administration in time range)  ceFAZolin (ANCEF) IVPB 1 g/50 mL premix ( Intravenous Stopped 01/14/19 1756)  Tdap (BOOSTRIX) injection 0.5 mL (0.5 mLs Intramuscular Given 01/14/19 1757)     Initial Impression / Assessment and Plan / ED Course  I have reviewed the triage vital signs and the nursing notes.  Pertinent labs & imaging results that were available during my care of the patient were reviewed by me and considered in my medical decision making (see chart for details).  68 year old male, otherwise healthy, presents with jagged laceration to the right thumb after table saw injury.   Thumb is neurovascularly intact.  X-ray does show a mildly displaced and comminuted fracture of the distal phalanx. Given open fracture will consult with hand surgery.  Vaccine given and patient given 1 g of Ancef.  Case discussed with Dr. Lenon Curt with hand surgery who recommends washout and closure in the emergency department with splinting and he will see the patient for close follow-up in the office.  Finger was anesthetized using digital block washed out copiously with greater than 1 L of normal saline.  There were multiple small shallow skin flaps that were cut away and debrided and then the wound was approximated as best possible with absorbable Vicryl repeat sutures.  Able to close the majority of areas with good approximation.  Given some open areas due to missing skin, pressure dressing applied with good hemostasis.  Given bulky dressing unable to apply splint at this time but patient was provided splint to use at home.  I have asked him to keep dressing on for at least 48 hours.  He was sent home with Keflex for antibiotic prophylaxis.  Strict return precautions discussed and hand surgery follow-up.  Patient expresses understanding and agreement with plan.  Discharged home in good condition.  Final Clinical Impressions(s) / ED Diagnoses   Final diagnoses:  Open displaced fracture of distal phalanx of right thumb, initial encounter  Laceration of right thumb without foreign body without damage to nail, initial encounter    ED Discharge Orders         Ordered    cephALEXin (KEFLEX) 500 MG capsule  4 times daily     01/14/19 2036           Jacqlyn Larsen, PA-C 01/18/19 1645    Gareth Morgan, MD 01/20/19 2059

## 2019-01-14 NOTE — Discharge Instructions (Signed)
Take antibiotics as directed.  Motrin or Tylenol as needed for pain.  Please call Dr. Brennan Bailey office on Monday morning to schedule follow-up appointment.  Your x-ray showed a fracture beneath the laceration.  We repaired this as best possible, keep dressing in place for the next 2 days and then you can change to a less bulky dressing, if you have any bleeding hold pressure for at least 15 minutes, if you are unable to control bleeding return to the emergency department.

## 2019-01-14 NOTE — ED Notes (Signed)
Hand surgery paged by [facility], will call PA Kelsey back on her portable phone.

## 2019-01-14 NOTE — ED Notes (Signed)
Dressing around right thumb wound. Bleeding controlled.

## 2019-01-14 NOTE — ED Notes (Signed)
Patient transported to X-ray 

## 2019-01-14 NOTE — ED Notes (Signed)
ED Provider at bedside. 

## 2019-01-14 NOTE — ED Triage Notes (Signed)
Pt states he cut right thumb on table saw ~30 min PTA-EMS was called-dsg applied-pt refused transport and drove self to ED-NAD-steady gait

## 2019-02-01 ENCOUNTER — Other Ambulatory Visit: Payer: Self-pay | Admitting: Interventional Radiology

## 2019-02-01 DIAGNOSIS — K7031 Alcoholic cirrhosis of liver with ascites: Secondary | ICD-10-CM

## 2019-05-16 ENCOUNTER — Other Ambulatory Visit: Payer: Self-pay | Admitting: Interventional Radiology

## 2019-06-09 ENCOUNTER — Other Ambulatory Visit: Payer: PRIVATE HEALTH INSURANCE

## 2019-07-05 ENCOUNTER — Ambulatory Visit
Admission: RE | Admit: 2019-07-05 | Discharge: 2019-07-05 | Disposition: A | Discharge status: Home / Self Care | Payer: PRIVATE HEALTH INSURANCE | Source: Ambulatory Visit | Attending: Interventional Radiology | Admitting: Interventional Radiology

## 2019-07-05 ENCOUNTER — Ambulatory Visit
Admission: RE | Admit: 2019-07-05 | Discharge: 2019-07-05 | Disposition: A | Discharge status: Home / Self Care | Payer: Medicare Other | Source: Ambulatory Visit | Attending: Interventional Radiology | Admitting: Interventional Radiology

## 2019-07-05 ENCOUNTER — Encounter: Payer: Self-pay | Admitting: *Deleted

## 2019-07-05 DIAGNOSIS — K7031 Alcoholic cirrhosis of liver with ascites: Secondary | ICD-10-CM

## 2019-07-05 HISTORY — PX: IR RADIOLOGIST EVAL & MGMT: IMG5224

## 2019-07-05 NOTE — Progress Notes (Signed)
Patient ID: Jimmy Sanford, male   DOB: [birth year], 69 y.o.   MRN: [id number]       Chief Complaint: Patient was seen in consultation today for f/u TIPS at the request of Shannelle Alguire  Referring Physician(s): Badreddine  History of Present Illness: Jimmy Sanford is a 69 y.o. male known to our service from TIPS performed 01/19/2018 for recurrent large volume symptomatic abdominal ascites which had been requiring multiple percutaneous paracentesis procedures.  He is done very well post procedure.  No need for paracentesis in the past year.  He recently restarted his lactulose about 2 weeks ago secondary to constipation.  No suggestion of hepatic encephalopathy.  He has had some weight gain he attributes to his "Covid 15".  He stays very active and social.  He states that his renal insufficiency has improved with medical therapy.  He is using milk thistle and feels that it helps. Past Medical History:  Diagnosis Date  . Anxiety   . Cirrhosis [facility])     Past Surgical History:  Procedure Laterality Date  . IR RADIOLOGIST EVAL & MGMT  12/17/2017  . IR RADIOLOGIST EVAL & MGMT  02/18/2018  . IR RADIOLOGIST EVAL & MGMT  07/05/2019    Allergies: Patient has no known allergies.  Medications: Prior to Admission medications   Medication Sig Start Date End Date Taking? Authorizing Provider  cephALEXin (KEFLEX) 500 MG capsule Take 1 capsule (500 mg total) by mouth 4 (four) times daily. 01/14/19   Jacqlyn Larsen, PA-C  citalopram (CELEXA) 20 MG tablet Take 20 mg by mouth daily.    [provider]  ergocalciferol (VITAMIN D2) 50000 units capsule Take 50,000 Units by mouth once a week.    [provider]  folic acid (FOLVITE) 1 MG tablet Take 1 mg by mouth daily.    [provider]  lactulose (CHRONULAC) 10 GM/15ML solution Take by mouth daily.    [provider]  milk thistle 175 MG tablet Take 175 mg by mouth daily.    [provider]  pantoprazole (PROTONIX) 40  MG tablet Take 40 mg by mouth daily.    [provider]  sildenafil (REVATIO) 20 MG tablet Take 20 mg by mouth 3 (three) times daily.    [provider]  spironolactone (ALDACTONE) 50 MG tablet Take 50 mg by mouth daily.    [provider]  Testosterone 30 MG/ACT SOLN Place 1 Pump onto the skin daily.    [provider]  traZODone (DESYREL) 50 MG tablet Take 50 mg by mouth at bedtime.    [provider]     No family history on file.  Social History   Socioeconomic History  . Marital status: Married    Spouse name: Not on file  . Number of children: Not on file  . Years of education: Not on file  . Highest education level: Not on file  Occupational History  . Not on file  Tobacco Use  . Smoking status: Never Smoker  . Smokeless tobacco: Former Systems developer    Types: Chew  Substance and Sexual Activity  . Alcohol use: Not Currently  . Drug use: Never  . Sexual activity: Not on file  Other Topics Concern  . Not on file  Social History Narrative  . Not on file   Social Determinants of Health   Financial Resource Strain:   . Difficulty of Paying Living Expenses: Not on file  Food Insecurity:   . Worried About [employer] of  Food in the Last Year: Not on file  . Ran Out of Food in the Last Year: Not on file  Transportation Needs:   . Lack of Transportation (Medical): Not on file  . Lack of Transportation (Non-Medical): Not on file  Physical Activity:   . Days of Exercise per Week: Not on file  . Minutes of Exercise per Session: Not on file  Stress:   . Feeling of Stress : Not on file  Social Connections:   . Frequency of Communication with Friends and Family: Not on file  . Frequency of Social Gatherings with Friends and Family: Not on file  . Attends Religious Services: Not on file  . Active Member of Clubs or Organizations: Not on file  . Attends Archivist Meetings: Not on file  . Marital Status: Not on file    ECOG  Status: 0 - Asymptomatic    Physical Exam Constitutional: Oriented to person, place, and time. Well-developed and well-nourished.  Obese.  No distress.  Vital Signs: BP 133/83 (BP Location: Right Arm)   Pulse 64   Temp 98 F (36.7 C)   SpO2 97%    HENT:  Head: Normocephalic and atraumatic.  Eyes: Conjunctivae and EOM are normal. Right eye exhibits no discharge. Left eye exhibits no discharge. No scleral icterus.  Neck: No JVD present.  Pulmonary/Chest: Effort normal. No stridor. No respiratory distress.  Abdomen: soft, non distended Neurological:  alert and oriented to person, place, and time.  Skin: Skin is warm and dry.  not diaphoretic.  Psychiatric:   normal mood and affect.   behavior is normal. Judgment and thought content normal.   Mallampati Score:  Review of Systems  Review of Systems: A 12 point ROS discussed and pertinent positives are indicated in the HPI above.  All other systems are negative.   Imaging: IR Radiologist Eval & Mgmt  Result Date: 07/05/2019 Please refer to notes tab for details about interventional procedure. (Op Note)   Labs:  CBC: No results for input(s): WBC, HGB, HCT, PLT in the last 8760 hours.  COAGS: No results for input(s): INR, APTT in the last 8760 hours.  BMP: No results for input(s): NA, K, CL, CO2, GLUCOSE, BUN, CALCIUM, CREATININE, GFRNONAA, GFRAA in the last 8760 hours.  Invalid input(s): CMP  LIVER FUNCTION TESTS: No results for input(s): BILITOT, AST, ALT, ALKPHOS, PROT, ALBUMIN in the last 8760 hours.  TUMOR MARKERS: No results for input(s): AFPTM, CEA, CA199, CHROMGRNA in the last 8760 hours.  Assessment and Plan:  My impression is that he is doing well 18 months post TIPS creation.  His presenting recurrent abdominal ascites has not recurred.  He is having no hepatic en he is using cephalopathy symptoms or other apparent complication.  Today's Doppler ultrasound looks good.  We discussed the expectation of  continued yearly surveillance Doppler ultrasound to assess for any early TIPS stenosis.  We discussed the high likelihood of long-term durable function of the TIPS.  We discussed the importance of notifying me right away should he notice reaccumulation of abdominal ascites.  We discussed the importance of using lactulose as needed primarily for prevention of hepatic cephalopathy.  He seemed to understand and had his questions answered.  We will plan to  get a follow-up liver Doppler in 12-15 months and I will see him back after that.  Thank you for this interesting consult.  I greatly enjoyed meeting Macklyn Parpart and look forward to participating in their care.  A  copy of this report was sent to the requesting provider on this date.  Electronically Signed: Rickard Rhymes 07/05/2019, 9:35 AM   I spent a total of    25 Minutes in face to face in clinical consultation, greater than 50% of which was counseling/coordinating care for follow-up TIPS.

## 2020-09-18 ENCOUNTER — Other Ambulatory Visit: Payer: Self-pay | Admitting: Interventional Radiology

## 2020-09-18 DIAGNOSIS — K7031 Alcoholic cirrhosis of liver with ascites: Secondary | ICD-10-CM

## 2020-10-10 ENCOUNTER — Encounter: Payer: Self-pay | Admitting: *Deleted

## 2020-10-10 ENCOUNTER — Ambulatory Visit
Admission: RE | Admit: 2020-10-10 | Discharge: 2020-10-10 | Disposition: A | Discharge status: Home / Self Care | Payer: Medicare Other | Source: Ambulatory Visit | Attending: Interventional Radiology | Admitting: Interventional Radiology

## 2020-10-10 DIAGNOSIS — K7031 Alcoholic cirrhosis of liver with ascites: Secondary | ICD-10-CM

## 2020-10-10 HISTORY — PX: IR RADIOLOGIST EVAL & MGMT: IMG5224

## 2020-10-10 NOTE — Progress Notes (Signed)
Patient ID: Jimmy Sanford, male   DOB: 1951/05/02, 70 y.o.   MRN: [id number]        Chief Complaint: Patient was seen in consultation today for TIPS surveillance at the request of Louetta Hollingshead  Referring Physician(s): Badreddine,Rami    History of Present Illness: Jimmy Sanford is a 70 y.o. male  known to our service from 01/19/2018 TIPS performed  for recurrent large volume symptomatic abdominal ascites which had been requiring multiple percutaneous paracentesis procedures.  He did very well post procedure. 02/18/2018 TIPS ultrasound shows good flow, some recurrent ascites  07/05/2019 TIPS ultrasound shows good flow, no ascites  No need for paracentesis in the past year.  He recently restarted his lactulose about 2 weeks ago secondary to early hepatic encephalopathy, which he feels has resolved.  He notes some early satiety but no weight loss.  He stays very active and social.     Past Medical History:  Diagnosis Date  . Anxiety   . Cirrhosis [facility])     Past Surgical History:  Procedure Laterality Date  . IR RADIOLOGIST EVAL & MGMT  12/17/2017  . IR RADIOLOGIST EVAL & MGMT  02/18/2018  . IR RADIOLOGIST EVAL & MGMT  07/05/2019    Allergies: Patient has no known allergies.  Medications: Prior to Admission medications   Medication Sig Start Date End Date Taking? Authorizing Provider  cephALEXin (KEFLEX) 500 MG capsule Take 1 capsule (500 mg total) by mouth 4 (four) times daily. 01/14/19   Jacqlyn Larsen, PA-C  citalopram (CELEXA) 20 MG tablet Take 20 mg by mouth daily.    [provider]  ergocalciferol (VITAMIN D2) 50000 units capsule Take 50,000 Units by mouth once a week.    [provider]  folic acid (FOLVITE) 1 MG tablet Take 1 mg by mouth daily.    [provider]  lactulose (CHRONULAC) 10 GM/15ML solution Take by mouth daily.    [provider]  milk thistle 175 MG tablet Take 175 mg by mouth daily.    [provider]  pantoprazole  (PROTONIX) 40 MG tablet Take 40 mg by mouth daily.    [provider]  sildenafil (REVATIO) 20 MG tablet Take 20 mg by mouth 3 (three) times daily.    [provider]  spironolactone (ALDACTONE) 50 MG tablet Take 50 mg by mouth daily.    [provider]  Testosterone 30 MG/ACT SOLN Place 1 Pump onto the skin daily.    [provider]  traZODone (DESYREL) 50 MG tablet Take 50 mg by mouth at bedtime.    [provider]     No family history on file.  Social History   Socioeconomic History  . Marital status: Married    Spouse name: Not on file  . Number of children: Not on file  . Years of education: Not on file  . Highest education level: Not on file  Occupational History  . Not on file  Tobacco Use  . Smoking status: Never Smoker  . Smokeless tobacco: Former Systems developer    Types: Secondary school teacher  . Vaping Use: Never used  Substance and Sexual Activity  . Alcohol use: Not Currently  . Drug use: Never  . Sexual activity: Not on file  Other Topics Concern  . Not on file  Social History Narrative  . Not on file   Social Determinants of Health   Financial Resource Strain: Not on file  Food Insecurity: Not on file  Transportation Needs:  Not on file  Physical Activity: Not on file  Stress: Not on file  Social Connections: Not on file    ECOG Status: 1 - Symptomatic but completely ambulatory  Review of Systems: A 12 point ROS discussed and pertinent positives are indicated in the HPI above.  All other systems are negative.  Review of Systems  Vital Signs: There were no vitals taken for this visit.  Physical Exam Constitutional: Oriented to person, place, and time. Well-developed and well-nourished. No distress.   HENT:  Head: Normocephalic and atraumatic.  Eyes: Conjunctivae and EOM are normal. Right eye exhibits no discharge. Left eye exhibits no discharge. No scleral icterus.  Neck: No JVD present.  Pulmonary/Chest: Effort  normal. No stridor. No respiratory distress.  Abdomen: soft, obese, non distended Neurological:  alert and oriented to person, place, and time.  Skin: Skin is warm and dry.  not diaphoretic.  Psychiatric:   normal mood and affect.   behavior is normal. Judgment and thought content normal.     Imaging: CLINICAL DATA:  Alcoholic cirrhosis with recurrent abdominal ascites, status post TIPS creation. Routine follow-up.   EXAM: DUPLEX ULTRASOUND OF LIVER AND TIPS SHUNT   TECHNIQUE: Color and duplex Doppler ultrasound was performed to evaluate the hepatic in-flow and out-flow vessels.   COMPARISON:  07/05/2019 and previous   FINDINGS: Portal Vein Velocities (all hepatopetal)   Main:  49 cm/sec   Right:  39 cm/sec   Left:  11 cm/sec   TIPS Stent Velocities   Proximal:  117 cm/sec   Mid:  119   Distal:  112 cm/sec   IVC: Present and patent with normal respiratory phasicity.   Hepatic Vein Velocities (all hepatofugal)   Right:  27 cm/sec   Mid:  57 cm/sec   Left:  41 cm/sec   Splenic Vein: 41   Superior Mesenteric Vein: 23   Hepatic Artery: 81 cm/sec   Ascities: Small-moderate volume perihepatic   Varices: None identified   Other findings: None   IMPRESSION: 1. Good antegrade flow through the TIPS. 2. Small-moderate perihepatic ascites, new since 04/16/2020.     Electronically Signed   By: Lucrezia Europe M.D.   On: 10/10/2020 14:44   Labs:  CBC:  Ref Range & Units 2 wk ago  WBC 4.4 - 11.0 x 10*3/uL 7.1   RBC 4.50 - 5.90 x 10*6/uL 3.98 Low    Hemoglobin 14.0 - 17.5 G/DL 13.7 Low    Hematocrit 41.5 - 50.4 % 39.1 Low    MCV 80.0 - 96.0 FL 98.2 High    MCH 27.5 - 33.2 PG 34.4 High    MCHC 33.0 - 37.0 G/DL 35.0   RDW % 14.1   Platelets 150 - 450 X 10*3/uL 103 Low    MPV 6.8 - 10.2 FL 8.2   Neutrophil % % 76   Lymphocyte % % 15   Monocyte % % 6   Eosinophil % % 2   Basophil % % 1   Neutrophil Absolute 1.8 - 7.8 x 10*3/uL 5.4   Lymphocyte Absolute  1.0 - 4.8 x 10*3/uL 1.1   Monocyte Absolute 0.0 - 0.8 x 10*3/uL 0.4   Eosinophil Absolute 0.0 - 0.5 x 10*3/uL 0.1   Basophil Absolute 0.0 - 0.2 x 10*3/uL 0.1   Resulting Agency  [facility]  Specimen Collected: 09/20/20 10:18 Last Resulted: 09/20/20 15:24     COAGS: No results for input(s): INR, APTT in the last 8760 hours.  BMP: Renal  Function Panel Order: [id number]  Ref Range & Units 5 mo ago  Sodium 135 - 146 MMOL/L 140   Potassium 3.5 - 5.3 MMOL/L 3.2 Low    Chloride 98 - 110 MMOL/L 100   CO2 23 - 30 MMOL/L 30   BUN 8 - 24 MG/DL 11   Glucose 70 - 99 MG/DL 121 High    Comment: Patients taking eltrombopag at doses >/= 100 mg daily may show falsely elevated values of 10% or greater.  Calcium 8.5 - 10.5 MG/DL 8.9   Phosphorus 2.5 - 4.5 MG/DL 2.5   Comment: Patients taking eltrombopag at doses >/= 100 mg daily may show falsely elevated values of 10% or greater.  Albumin  3.5 - 5.0 G/DL 3.6   Creatinine 0.50 - 1.50 MG/DL 1.07   Anion Gap 4 - 14 MMOL/L 10   Est. GFR Non-African American >=60 ML/MIN/1.73 M*2  ML/MIN/1.73 M*2 71   Comment: GFR estimated by CKD-EPI equations, reportable up to 90 ML/MIN/1.73 [facility]  Specimen Collected: 04/23/20 09:29 Last Resulted: 04/23/20 11:36     LIVER FUNCTION TESTS: No results for input(s): BILITOT, AST, ALT, ALKPHOS, PROT, ALBUMIN in the last 8760 hours.  TUMOR MARKERS: No results for input(s): AFPTM, CEA, CA199, CHROMGRNA in the last 8760 hours.  Assessment and Plan:  My impression is that he is doing very well post TIPS creation.  Although we do see a small amount of paraparetic ascites today, this is clinically asymptomatic and there is no current indication for action.  Will continue to get a yearly follow-up surveillance ultrasound.  He is well aware of the importance of limiting ethanol usage.  He also understands the prophylactic importance of the  lactulose.  He knows to call in the interval should he have any questions or problems, or should he notice progressive abdominal distention suggesting worsening ascites.  Thank you for this interesting consult.  I greatly enjoyed meeting Jimmy Sanford and look forward to participating in their care.  A copy of this report was sent to the requesting provider on this date.  Electronically Signed: Rickard Rhymes 10/10/2020, 1:33 PM   I spent a total of    25 Minutes in face to face in clinical consultation, greater than 50% of which was counseling/coordinating care for alcoholic cirrhosis with ascites, post TIPS creation.

## 2020-11-21 ENCOUNTER — Encounter (HOSPITAL_BASED_OUTPATIENT_CLINIC_OR_DEPARTMENT_OTHER): Payer: Self-pay

## 2020-11-21 ENCOUNTER — Emergency Department (HOSPITAL_BASED_OUTPATIENT_CLINIC_OR_DEPARTMENT_OTHER)
Admission: EM | Admit: 2020-11-21 | Discharge: 2020-11-21 | Disposition: A | Discharge status: Home / Self Care | Payer: Medicare Other | Attending: Emergency Medicine | Admitting: Emergency Medicine

## 2020-11-21 ENCOUNTER — Emergency Department (HOSPITAL_BASED_OUTPATIENT_CLINIC_OR_DEPARTMENT_OTHER): Payer: Medicare Other

## 2020-11-21 ENCOUNTER — Other Ambulatory Visit: Payer: Self-pay

## 2020-11-21 DIAGNOSIS — R791 Abnormal coagulation profile: Secondary | ICD-10-CM | POA: Insufficient documentation

## 2020-11-21 DIAGNOSIS — R2232 Localized swelling, mass and lump, left upper limb: Secondary | ICD-10-CM | POA: Diagnosis present

## 2020-11-21 DIAGNOSIS — F17220 Nicotine dependence, chewing tobacco, uncomplicated: Secondary | ICD-10-CM | POA: Diagnosis not present

## 2020-11-21 DIAGNOSIS — L03113 Cellulitis of right upper limb: Secondary | ICD-10-CM | POA: Insufficient documentation

## 2020-11-21 LAB — COMPREHENSIVE METABOLIC PANEL
ALT: 17 U/L (ref 0–44)
AST: 31 U/L (ref 15–41)
Albumin: 2.6 g/dL — ABNORMAL LOW (ref 3.5–5.0)
Alkaline Phosphatase: 101 U/L (ref 38–126)
Anion gap: 4 — ABNORMAL LOW (ref 5–15)
BUN: 7 mg/dL — ABNORMAL LOW (ref 8–23)
CO2: 30 mmol/L (ref 22–32)
Calcium: 8.5 mg/dL — ABNORMAL LOW (ref 8.9–10.3)
Chloride: 100 mmol/L (ref 98–111)
Creatinine, Ser: 1.14 mg/dL (ref 0.61–1.24)
GFR, Estimated: >60 mL/min (ref >60)
Glucose, Bld: 95 mg/dL (ref 70–99)
Potassium: 4.6 mmol/L (ref 3.5–5.1)
Sodium: 134 mmol/L — ABNORMAL LOW (ref 135–145)
Total Bilirubin: 2.3 mg/dL — ABNORMAL HIGH (ref 0.3–1.2)
Total Protein: 5.8 g/dL — ABNORMAL LOW (ref 6.5–8.1)

## 2020-11-21 LAB — CBC WITH DIFFERENTIAL/PLATELET
Abs Immature Granulocytes: 0.04 10*3/uL (ref 0.00–0.07)
Basophils Absolute: 0 10*3/uL (ref 0.0–0.1)
Basophils Relative: 0 %
Eosinophils Absolute: 0.4 10*3/uL (ref 0.0–0.5)
Eosinophils Relative: 5 %
HCT: 36.8 % — ABNORMAL LOW (ref 39.0–52.0)
Hemoglobin: 12.7 g/dL — ABNORMAL LOW (ref 13.0–17.0)
Immature Granulocytes: 1 %
Lymphocytes Relative: 12 %
Lymphs Abs: 0.9 10*3/uL (ref 0.7–4.0)
MCH: 36.1 pg — ABNORMAL HIGH (ref 26.0–34.0)
MCHC: 34.5 g/dL (ref 30.0–36.0)
MCV: 104.5 fL — ABNORMAL HIGH (ref 80.0–100.0)
Monocytes Absolute: 0.6 10*3/uL (ref 0.1–1.0)
Monocytes Relative: 8 %
Neutro Abs: 5.2 10*3/uL (ref 1.7–7.7)
Neutrophils Relative %: 74 %
Platelets: 94 10*3/uL — ABNORMAL LOW (ref 150–400)
RBC: 3.52 MIL/uL — ABNORMAL LOW (ref 4.22–5.81)
RDW: 15.9 % — ABNORMAL HIGH (ref 11.5–15.5)
Smear Review: DECREASED
WBC: 7.1 10*3/uL (ref 4.0–10.5)
nRBC: 0 % (ref 0.0–0.2)

## 2020-11-21 LAB — PROTIME-INR
INR: 1.4 — ABNORMAL HIGH (ref 0.8–1.2)
Prothrombin Time: 17.3 seconds — ABNORMAL HIGH (ref 11.4–15.2)

## 2020-11-21 MED ORDER — SODIUM CHLORIDE 0.9 % IV SOLN
INTRAVENOUS | Status: DC | PRN
  Administered 2020-11-21: 500 mL via INTRAVENOUS

## 2020-11-21 MED ORDER — SODIUM CHLORIDE 0.9 % IV SOLN
3.0000 g | Freq: Once | INTRAVENOUS | Status: AC
  Administered 2020-11-21: 3 g via INTRAVENOUS
  Filled 2020-11-21: qty 8

## 2020-11-21 MED ORDER — AMOXICILLIN-POT CLAVULANATE 875-125 MG PO TABS
1.0000 | ORAL_TABLET | Freq: Two times a day (BID) | ORAL | 0 refills | Status: AC
Start: 2020-11-21 — End: ?

## 2020-11-21 NOTE — ED Provider Notes (Signed)
[facility] EMERGENCY DEPARTMENT Provider Note   CSN: [id number] Arrival date & time: 11/21/20  1505     History Chief Complaint  Patient presents with   Arm Swelling   Leg Swelling    Jimmy Sanford is a 70 y.o. male.  The history is provided by the patient and medical records.  Jimmy Sanford is a 70 y.o. male who presents to the Emergency Department complaining of edema. He presents to the ED complaining of swelling to the right arm.  Sxs started yesterday with rapid swelling throughout the right upper extremity.  He has associated itching and discomfort.  He exerpienced similar episode in the past due to cat scratch fever.  No known recent cat scratches or bites but he does have two cats.  He also reports 3 weeks of BLE edema.  He saw his pcp for this and his diuretic was increased and the edema has since improved.    No fever, chest pain, sob.  No prior DVT/PE.  Not on anticoagulation.  Has a hx/o cirrhosis.       Past Medical History:  Diagnosis Date   Anxiety    Cirrhosis ([place])     There are no problems to display for this patient.   Past Surgical History:  Procedure Laterality Date   IR RADIOLOGIST EVAL & MGMT  12/17/2017   IR RADIOLOGIST EVAL & MGMT  02/18/2018   IR RADIOLOGIST EVAL & MGMT  07/05/2019   IR RADIOLOGIST EVAL & MGMT  10/10/2020       No family history on file.  Social History   Tobacco Use   Smoking status: Never   Smokeless tobacco: Current    Types: Chew  Vaping Use   Vaping Use: Never used  Substance Use Topics   Alcohol use: Not Currently   Drug use: Never    Home Medications Prior to Admission medications   Medication Sig Start Date End Date Taking? Authorizing Provider  amoxicillin-clavulanate (AUGMENTIN) 875-125 MG tablet Take 1 tablet by mouth every 12 (twelve) hours. 11/21/20  Yes Quintella Reichert, MD  cephALEXin (KEFLEX) 500 MG capsule Take 1 capsule (500 mg total) by mouth 4 (four) times daily. 01/14/19   Jacqlyn Larsen, PA-C   citalopram (CELEXA) 20 MG tablet Take 20 mg by mouth daily.    [provider]  ergocalciferol (VITAMIN D2) 50000 units capsule Take 50,000 Units by mouth once a week.    [provider]  folic acid (FOLVITE) 1 MG tablet Take 1 mg by mouth daily.    [provider]  lactulose (CHRONULAC) 10 GM/15ML solution Take by mouth daily.    [provider]  milk thistle 175 MG tablet Take 175 mg by mouth daily.    [provider]  pantoprazole (PROTONIX) 40 MG tablet Take 40 mg by mouth daily.    [provider]  sildenafil (REVATIO) 20 MG tablet Take 20 mg by mouth 3 (three) times daily.    [provider]  spironolactone (ALDACTONE) 50 MG tablet Take 50 mg by mouth daily.    [provider]  Testosterone 30 MG/ACT SOLN Place 1 Pump onto the skin daily.    [provider]  traZODone (DESYREL) 50 MG tablet Take 50 mg by mouth at bedtime.    [provider]    Allergies    Patient has no known allergies.  Review of Systems   Review of Systems  All other systems reviewed and are negative.  Physical  Exam Updated Vital Signs BP 137/80 (BP Location: Right Arm)   Pulse 66   Temp 98.2 F (36.8 C) (Oral)   Resp 15   Ht 6' (1.829 m)   Wt 129.7 kg   SpO2 99%   BMI 38.79 kg/m   Physical Exam Vitals and nursing note reviewed.  Constitutional:      Appearance: He is well-developed.  HENT:     Head: Normocephalic and atraumatic.  Cardiovascular:     Rate and Rhythm: Normal rate and regular rhythm.     Heart sounds: No murmur heard. Pulmonary:     Effort: Pulmonary effort is normal. No respiratory distress.     Breath sounds: Normal breath sounds.  Abdominal:     Palpations: Abdomen is soft.     Tenderness: There is no abdominal tenderness. There is no guarding or rebound.  Musculoskeletal:        General: No tenderness.     Comments: 2-3 + pitting edema to the RUE from hand to mid upper arm.  There is  erythema from the hand to elbow.  There are areas of ecchymosis and purpura to hand.  There is 2-3+ pitting edema to BLE  Skin:    General: Skin is warm and dry.  Neurological:     Mental Status: He is alert and oriented to person, place, and time.  Psychiatric:        Behavior: Behavior normal.    ED Results / Procedures / Treatments   Labs (all labs ordered are listed, but only abnormal results are displayed) Labs Reviewed  COMPREHENSIVE METABOLIC PANEL - Abnormal; Notable for the following components:      Result Value   Sodium 134 (*)    BUN 7 (*)    Calcium 8.5 (*)    Total Protein 5.8 (*)    Albumin 2.6 (*)    Total Bilirubin 2.3 (*)    Anion gap 4 (*)    All other components within normal limits  CBC WITH DIFFERENTIAL/PLATELET - Abnormal; Notable for the following components:   RBC 3.52 (*)    Hemoglobin 12.7 (*)    HCT 36.8 (*)    MCV 104.5 (*)    MCH 36.1 (*)    RDW 15.9 (*)    Platelets 94 (*)    All other components within normal limits  PROTIME-INR - Abnormal; Notable for the following components:   Prothrombin Time 17.3 (*)    INR 1.4 (*)    All other components within normal limits    EKG None  Radiology US Venous Img Upper Right (DVT Study)  Result Date: 11/21/2020 CLINICAL DATA:  Edema. EXAM: RIGHT UPPER EXTREMITY VENOUS DOPPLER ULTRASOUND TECHNIQUE: Gray-scale sonography with graded compression, as well as color Doppler and duplex ultrasound were performed to evaluate the upper extremity deep venous system from the level of the subclavian vein and including the jugular, axillary, basilic, radial, ulnar and upper cephalic vein. Spectral Doppler was utilized to evaluate flow at rest and with distal augmentation maneuvers. COMPARISON:  None. FINDINGS: Contralateral Subclavian Vein: Respiratory phasicity is normal and symmetric with the symptomatic side. No evidence of thrombus. Normal compressibility. Internal Jugular Vein: No evidence of thrombus. Normal  compressibility, respiratory phasicity and response to augmentation. Subclavian Vein: No evidence of thrombus. Normal compressibility, respiratory phasicity and response to augmentation. Axillary Vein: No evidence of thrombus. Normal compressibility, respiratory phasicity and response to augmentation. Cephalic Vein: No evidence of thrombus. Normal compressibility, respiratory phasicity and response to augmentation.  Basilic Vein: No evidence of thrombus. Normal compressibility, respiratory phasicity and response to augmentation. Brachial Veins: No evidence of thrombus. Normal compressibility, respiratory phasicity and response to augmentation. Radial Veins: No evidence of thrombus. Normal compressibility, respiratory phasicity and response to augmentation. Ulnar Veins: No evidence of thrombus. Normal compressibility, respiratory phasicity and response to augmentation. IMPRESSION: No evidence of DVT within the right upper extremity. Electronically Signed   By: Margaretha Sheffield MD   On: 11/21/2020 18:01    Procedures Procedures   Medications Ordered in ED Medications  0.9 %  sodium chloride infusion ( Intravenous Stopped 11/21/20 1747)  Ampicillin-Sulbactam (UNASYN) 3 g in sodium chloride 0.9 % 100 mL IVPB (0 g Intravenous Stopped 11/21/20 1742)    ED Course  I have reviewed the triage vital signs and the nursing notes.  Pertinent labs & imaging results that were available during my care of the patient were reviewed by me and considered in my medical decision making (see chart for details).    MDM Rules/Calculators/A&P                          Pt with hx/o cirrhosis here for evaluation of redness and swelling to the RUE.  Has a hx/o cat scratch fever and this is similar.  He does have cats in the home but does not recall a direct scratch.  He is nontoxic appearing on eval.  Labs are near his baseline on review in care everywhere.  Vas [place] is neg for DVT.  Will treat for cellulitis with augmentin given  cats in the home.  Plan to d/c home with close outpatient follow up and return precautions.   Final Clinical Impression(s) / ED Diagnoses Final diagnoses:  Cellulitis of right upper extremity    Rx / DC Orders ED Discharge Orders          Ordered    amoxicillin-clavulanate (AUGMENTIN) 875-125 MG tablet  Every 12 hours        11/21/20 1827             Quintella Reichert, MD 11/21/20 1948

## 2020-11-21 NOTE — ED Triage Notes (Addendum)
Pt c/o swelling to right UE started last night-denies injury-states looks like the same as when he was dx with cat scratch fever in several years ago-pt also c/o bilat LE swelling x 1 month-states is better after diuretic rx-NAD-steady gait

## 2021-07-17 IMAGING — CR DG FINGER THUMB 2+V*R*
3 series · 3 of 3 positions shown · non-contrast
Comparison: None.

CLINICAL DATA: Right thumb laceration.

EXAM:
RIGHT THUMB 2+V

[x finger lateral right]
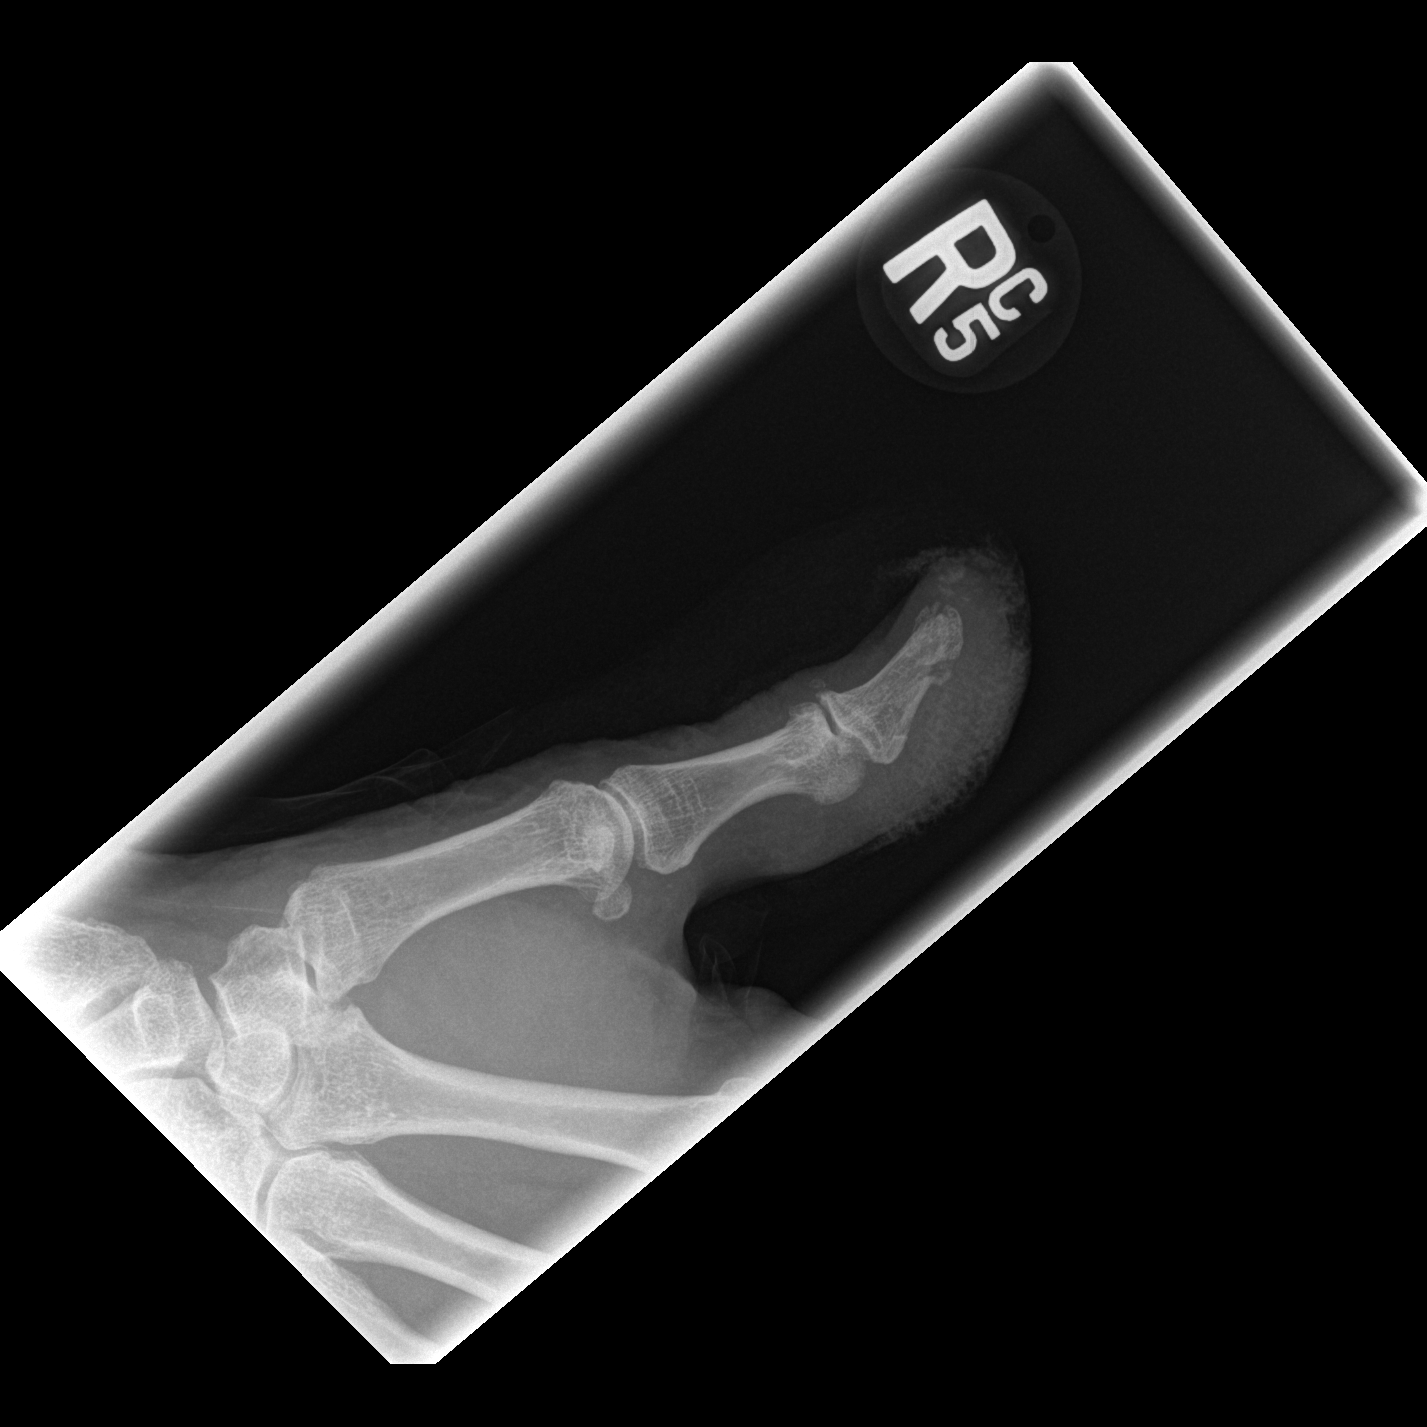

[x finger obl. right]
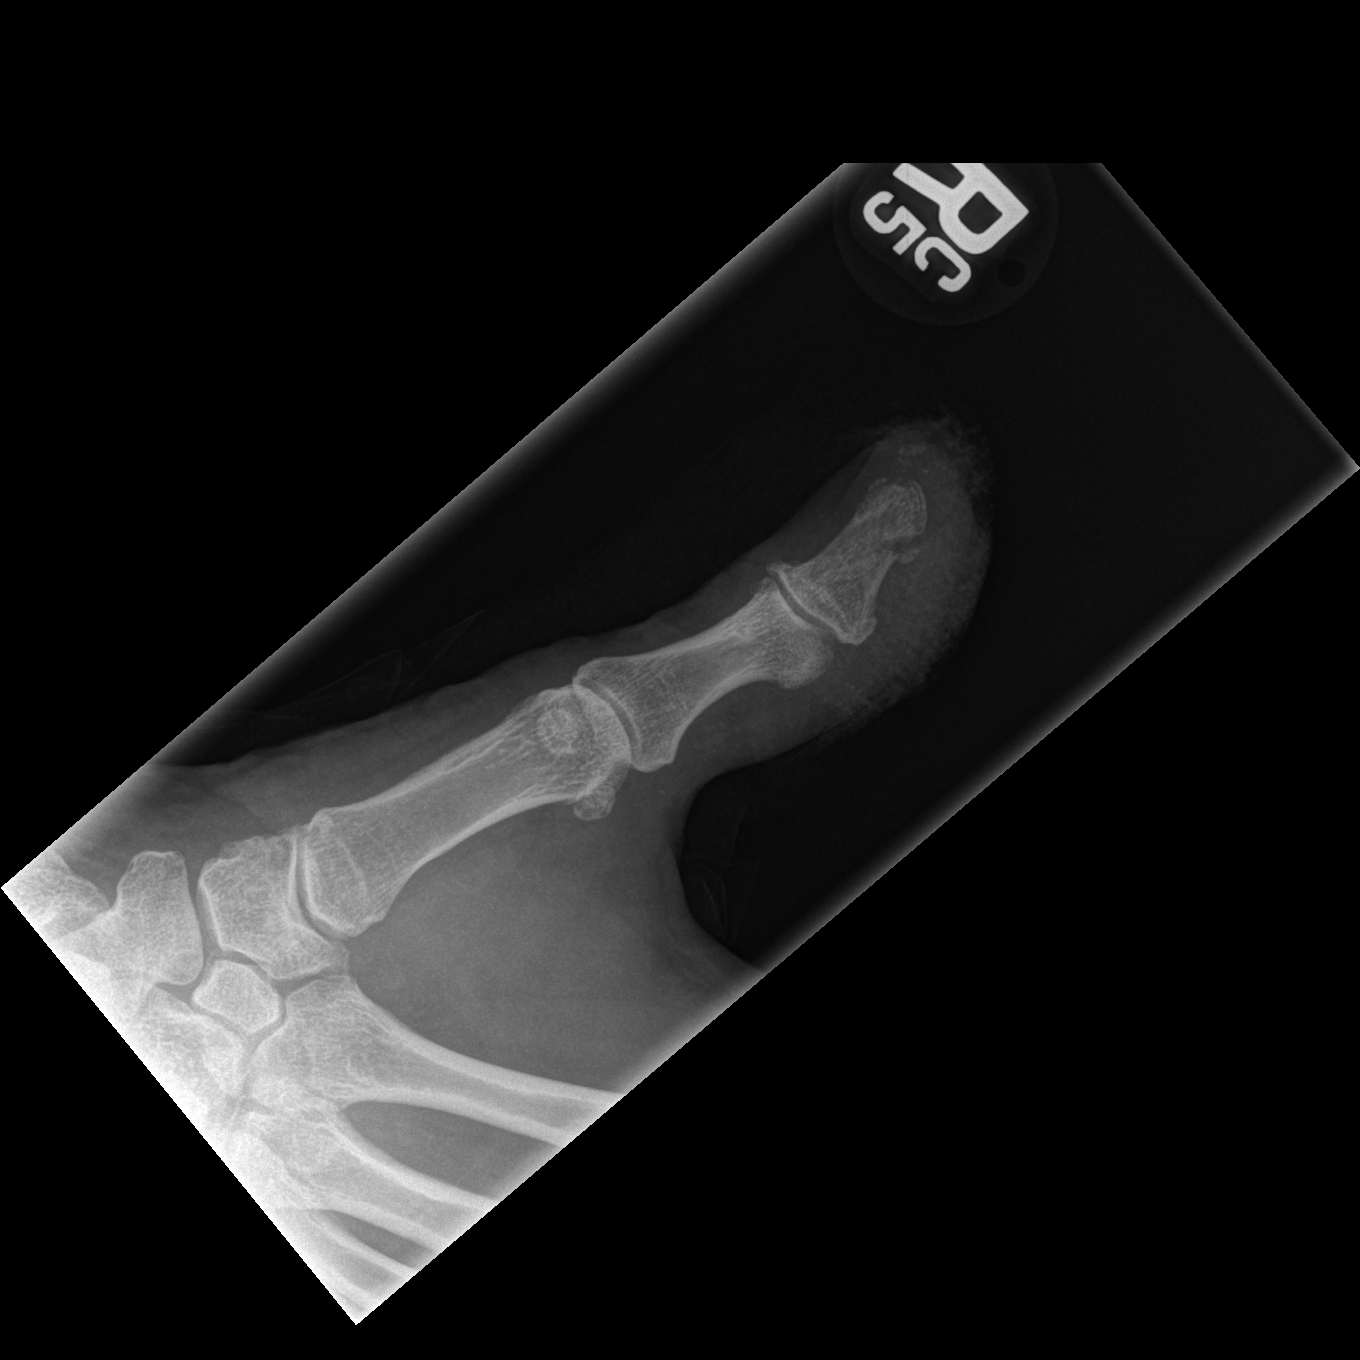

[x finger pa right]
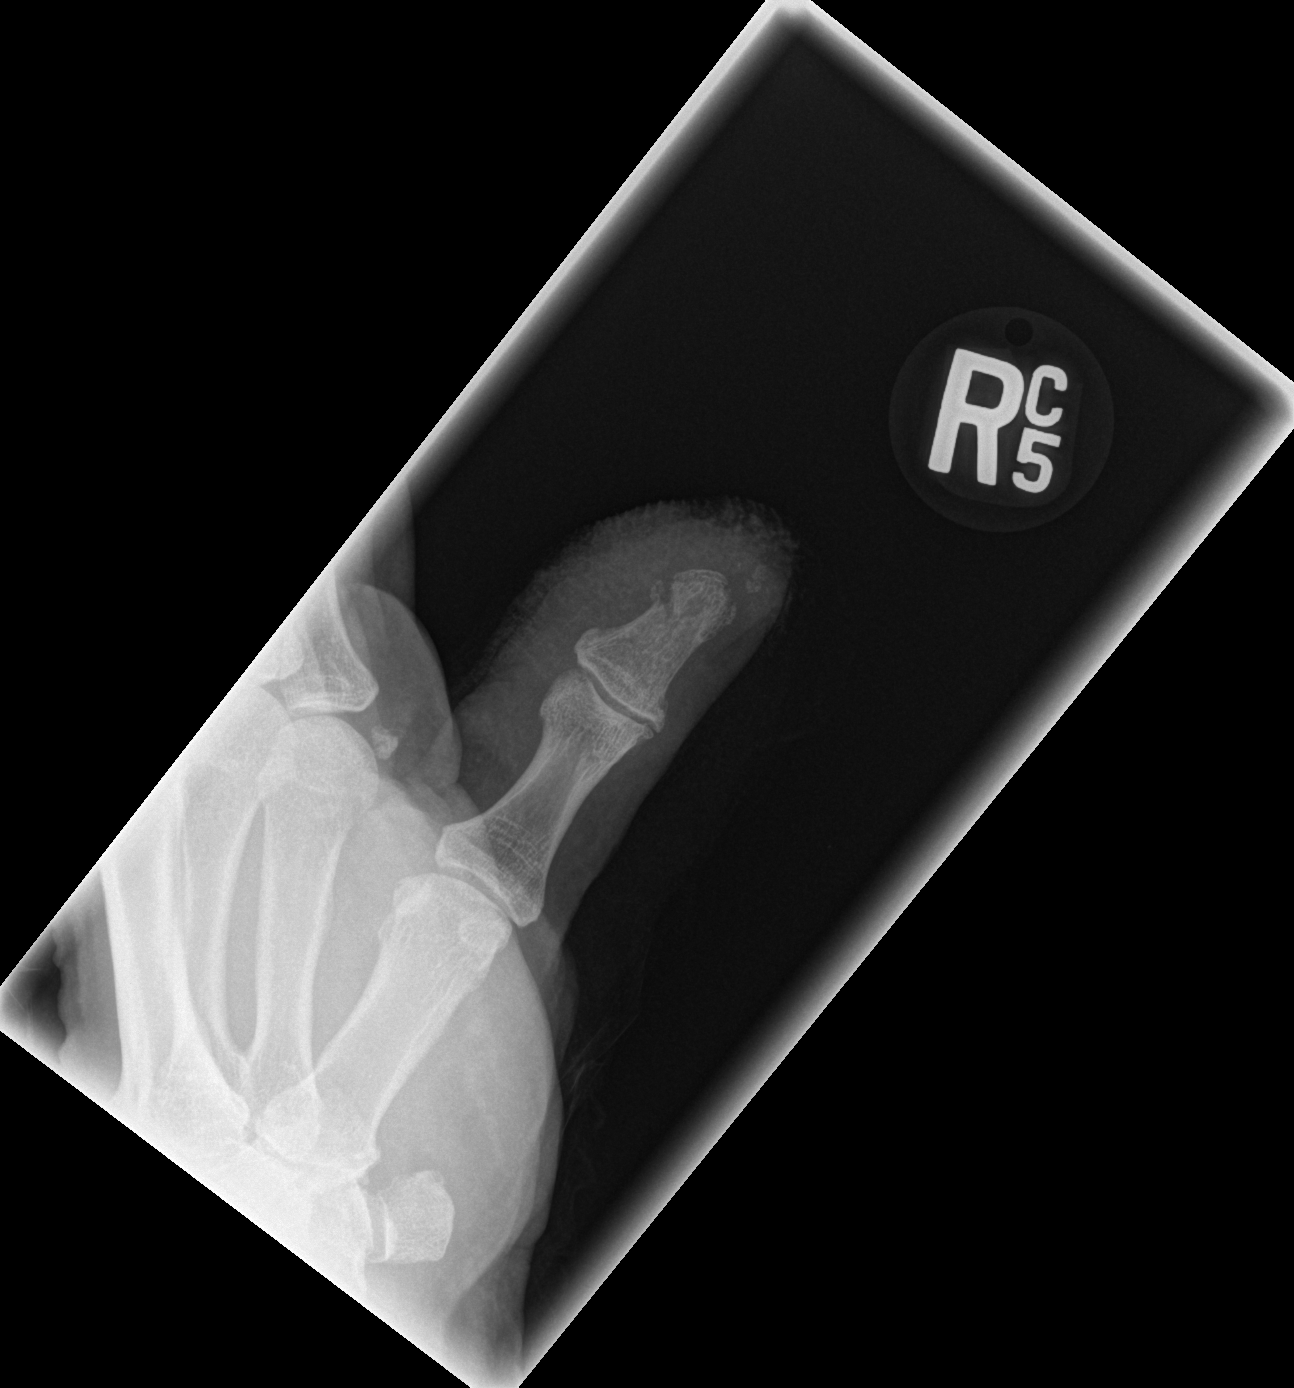

[3 of 3 positions shown; findings below may reference images not displayed]

FINDINGS: Mildly displaced and comminuted fracture is seen involving the
distal portion of the first distal phalanx. Overlying soft tissue
irregularity is noted consistent with laceration. No other bony
abnormality is noted. No radiopaque foreign body is noted. Joint
spaces are intact.
IMPRESSION: Mildly displaced and comminuted fracture is seen involving the
distal portion of the first distal phalanx with associated soft
tissue laceration.

## 2021-09-06 ENCOUNTER — Other Ambulatory Visit: Payer: Self-pay | Admitting: Interventional Radiology

## 2021-09-06 DIAGNOSIS — K7031 Alcoholic cirrhosis of liver with ascites: Secondary | ICD-10-CM

## 2021-10-18 ENCOUNTER — Other Ambulatory Visit: Payer: Medicare Other

## 2022-01-05 IMAGING — US US ART/VEN ABD/PELV/SCROTUM DOPPLER COMPLETE
1 series · 14 of 25 positions shown · non-contrast
Comparison: 02/18/2018

CLINICAL DATA: Cirrhosis, post tips creation 01/19/2018 for
recurrent large volume abdominal ascites, currently relatively
asymptomatic

EXAM:
DUPLEX ULTRASOUND OF LIVER AND TIPS SHUNT
TECHNIQUE: Color and duplex Doppler ultrasound was performed to evaluate the
hepatic in-flow and out-flow vessels.

[Series 1: us art/ven abd/pelv/scrotum doppler complete · 0.23mm/px · 14 of 35 slices shown]
[im 1/35]
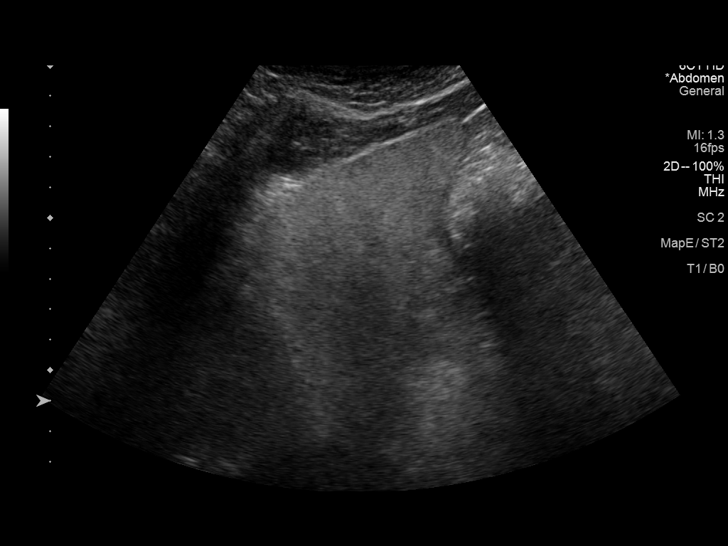
[im 3/35]
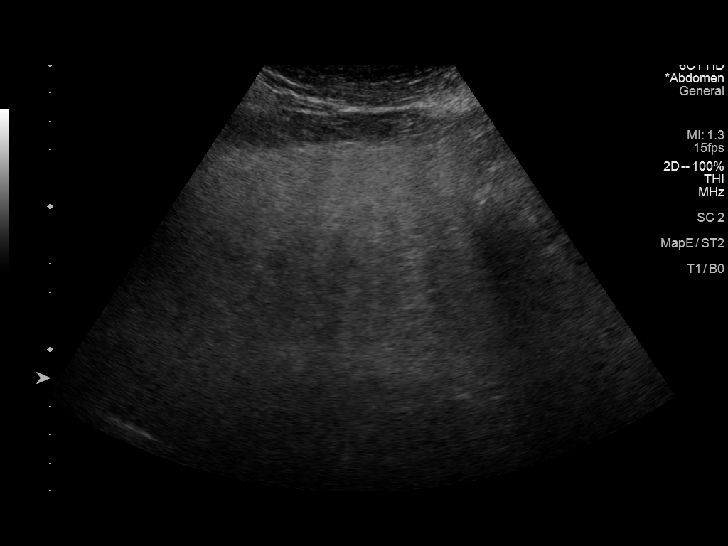
[im 6/35]
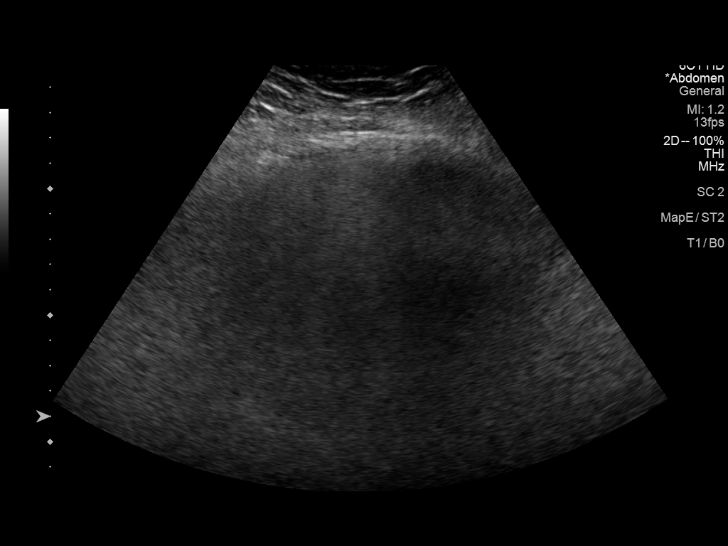
[im 9/35]
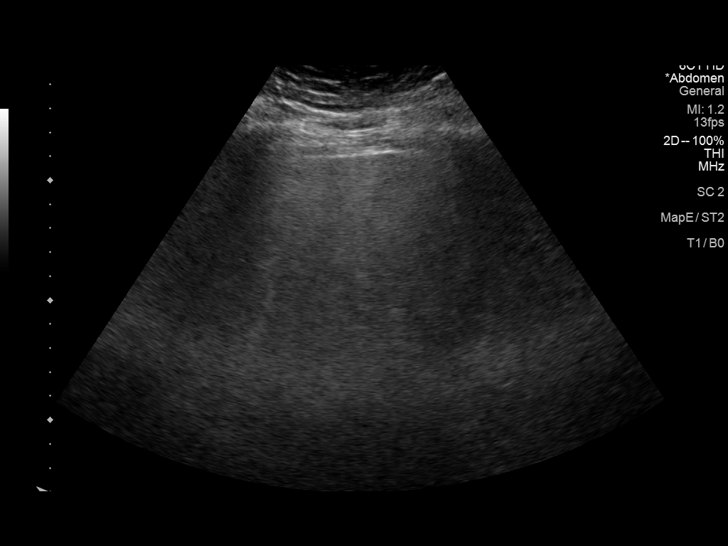
[im 12/35]
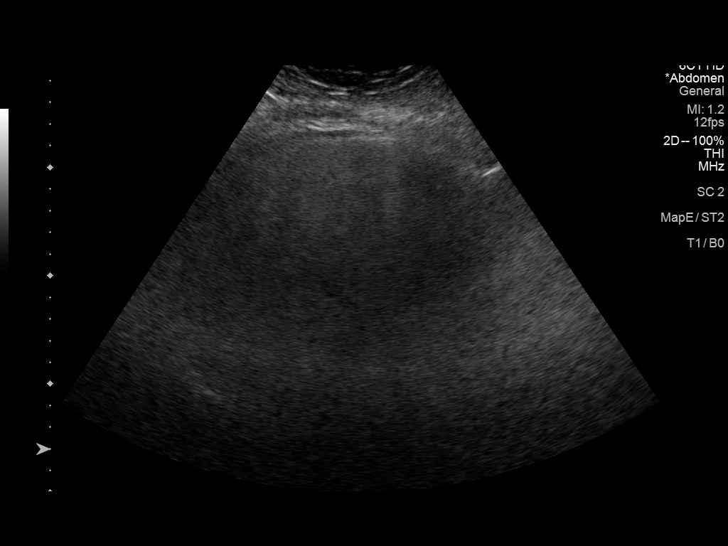
[im 13/35]
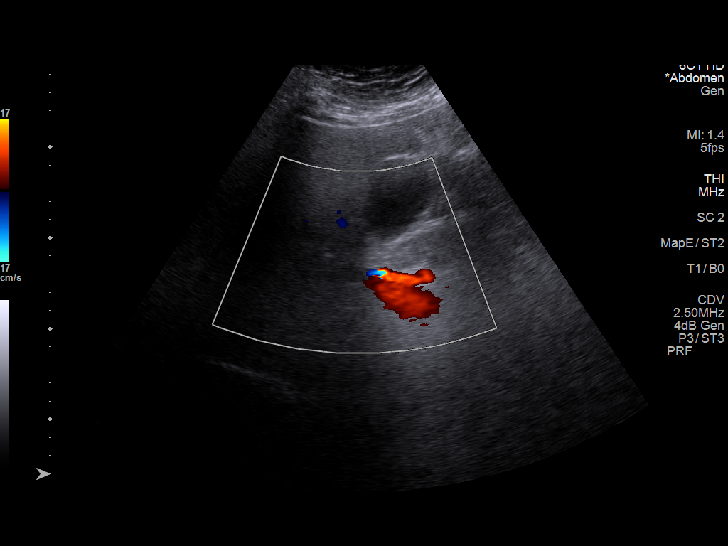
[im 16/35]
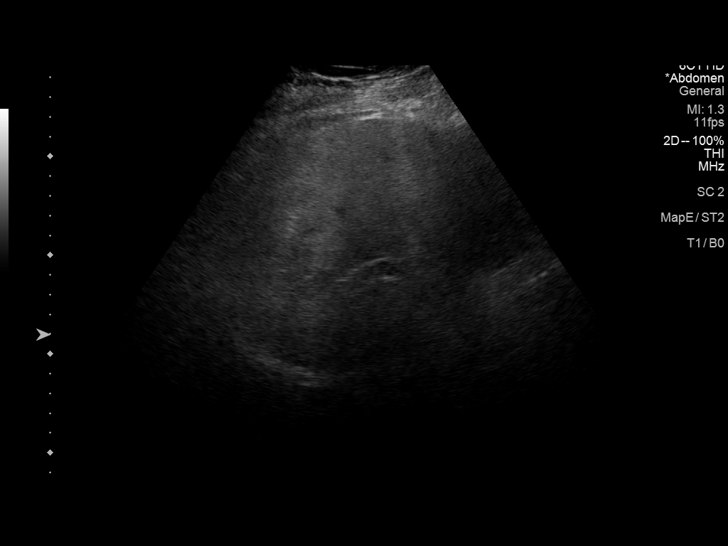
[im 19/35]
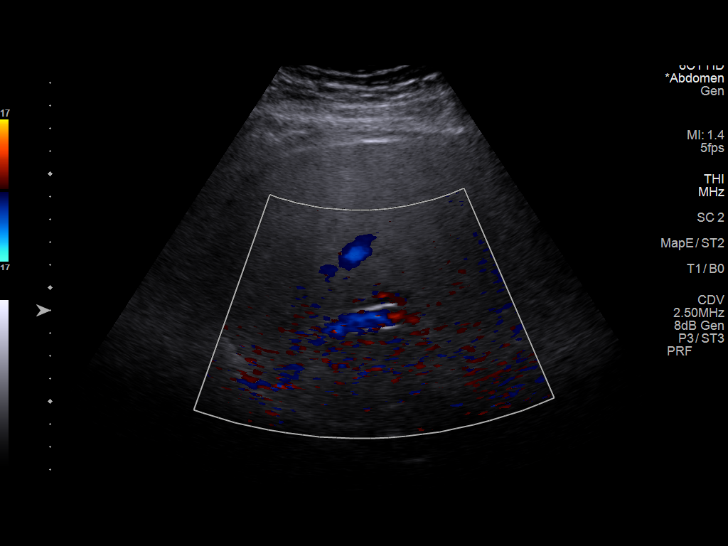
[im 22/35]
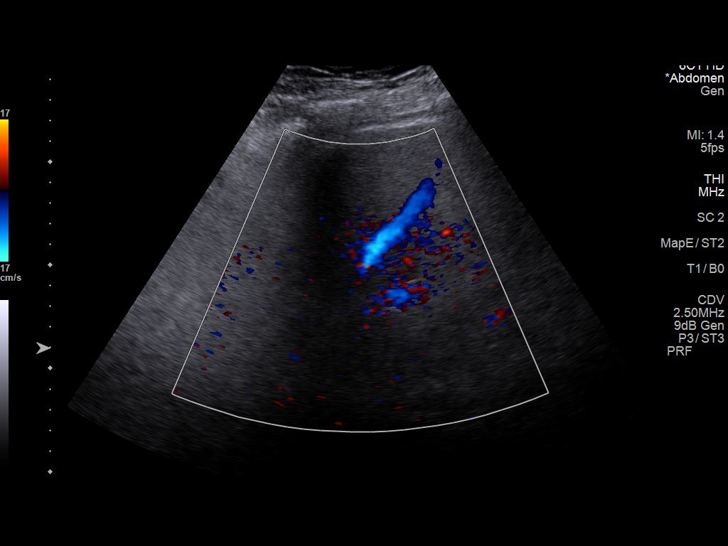
[im 23/35]
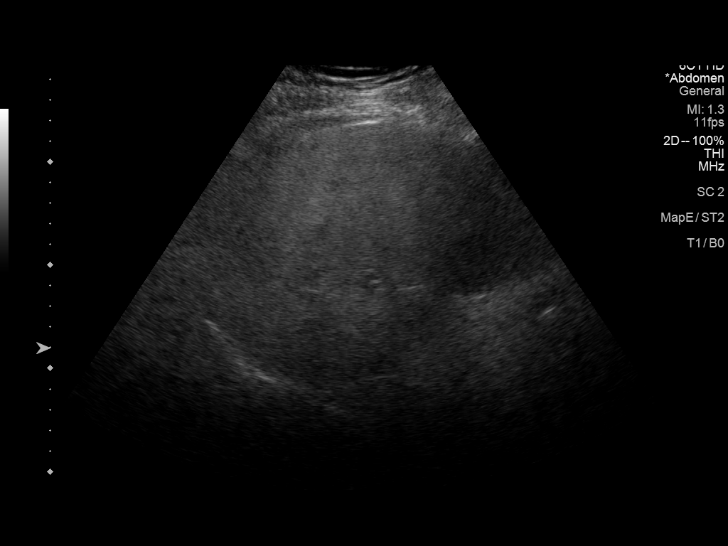
[im 26/35]
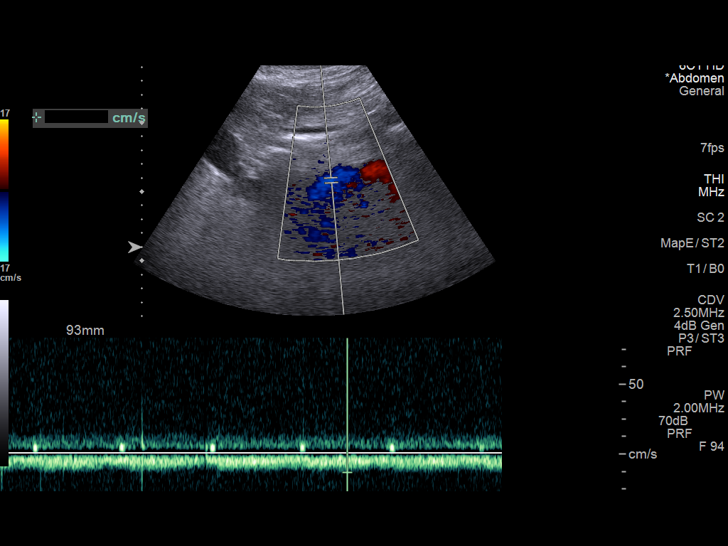
[im 29/35]
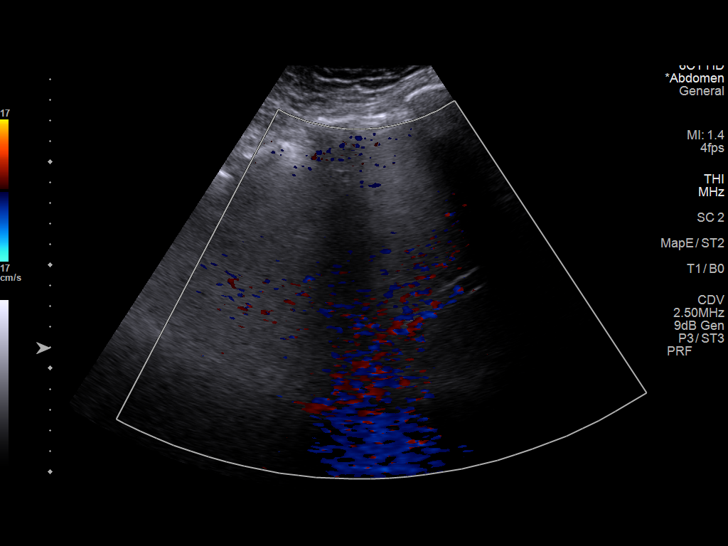
[im 32/35]
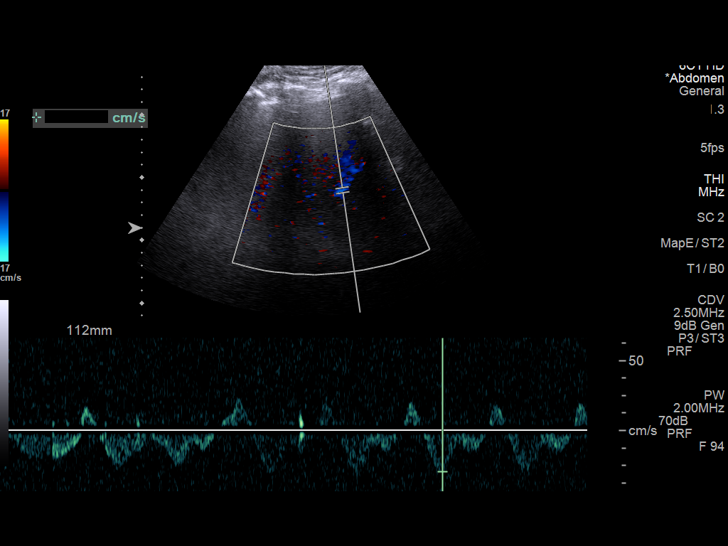
[im 35/35]
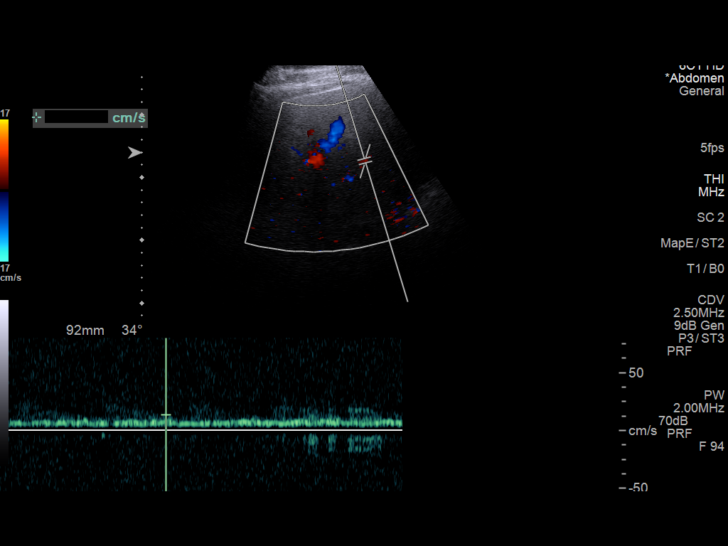

[14 of 25 positions shown; findings below may reference images not displayed]

FINDINGS: TIPS Shunt Velocities

Proximal:  19 cm/sec

Mid:  22 cm/sec

Distal:  36 cm/sec

Portal Vein Velocities (all hepatopetal):

Main:  25 cm/sec

Right:  19 cm/sec

Left:  14 cm/sec

Hepatic Vein Velocities (all hepatofugal):

Right:  36 cm/sec

Middle:  30 cm/sec

Left: Not visualized

Hepatic Artery Velocity: 147 cm/sec.

Splenic Vein Velocity: 13 cm/sec.

SMV: Not visualized

Varices: None identified

Ascites: None present

Other findings: Coarse heterogenous hepatic echotexture, difficult
to penetrate. No focal liver lesion or biliary ductal dilatation.
IMPRESSION: 1. Continued patency of TIPS with no focally elevated peak systolic
velocities to suggest stenosis.
2. No recurrence of abdominal ascites.

## 2022-01-14 ENCOUNTER — Other Ambulatory Visit: Payer: Self-pay | Admitting: Interventional Radiology

## 2022-01-14 DIAGNOSIS — K7031 Alcoholic cirrhosis of liver with ascites: Secondary | ICD-10-CM

## 2022-02-24 ENCOUNTER — Ambulatory Visit
Admission: RE | Admit: 2022-02-24 | Discharge: 2022-02-24 | Disposition: A | Discharge status: Home / Self Care | Payer: Medicare Other | Source: Ambulatory Visit | Attending: Interventional Radiology | Admitting: Interventional Radiology

## 2022-02-24 ENCOUNTER — Encounter: Payer: Self-pay | Admitting: Lab

## 2022-02-24 DIAGNOSIS — K7031 Alcoholic cirrhosis of liver with ascites: Secondary | ICD-10-CM

## 2022-02-24 HISTORY — PX: IR RADIOLOGIST EVAL & MGMT: IMG5224

## 2022-02-24 NOTE — Progress Notes (Signed)
Patient ID: Jimmy Sanford, male   DOB: 05/29/1950, 71 y.o.   MRN: [id number]       Chief Complaint: Patient was seen in consultation today for scheduled TIPS surveillance at the request of Henrique Parekh  Referring Physician(s): Badreddine,Rami   History of Present Illness: Jimmy Sanford is a 71 y.o. male  known to our service from 01/19/2018 TIPS performed  for recurrent large volume symptomatic abdominal ascites which had been requiring multiple percutaneous paracentesis procedures.  He did very well post procedure. 02/18/2018 TIPS ultrasound shows good flow, some recurrent ascites  07/05/2019 TIPS ultrasound shows good flow, no ascites 10/10/2020 TIPS ultrasound shows good flow, small-moderate perihepatic ascites 02/24/2022 TIPS ultrasound today again demonstrates good stable antegrade flow, small perihepatic ascites   No need for paracentesis in the past year.  He intermittently uses p.o. lactulose maybe 3-4 times per month when he admits signs of potential early hepatic encephalopathy.  He notes some early satiety but no weight loss.  He stays very active and social.  Avoids heavy alcohol and temptation.  He is planning a trip to the [place] in January.  Past Medical History:  Diagnosis Date   Anxiety    Cirrhosis ([place])     Past Surgical History:  Procedure Laterality Date   IR RADIOLOGIST EVAL & MGMT  12/17/2017   IR RADIOLOGIST EVAL & MGMT  02/18/2018   IR RADIOLOGIST EVAL & MGMT  07/05/2019   IR RADIOLOGIST EVAL & MGMT  10/10/2020    Allergies: Patient has no known allergies.  Medications: Prior to Admission medications   Medication Sig Start Date End Date Taking? Authorizing Provider  amoxicillin-clavulanate (AUGMENTIN) 875-125 MG tablet Take 1 tablet by mouth every 12 (twelve) hours. 11/21/20   Quintella Reichert, MD  cephALEXin (KEFLEX) 500 MG capsule Take 1 capsule (500 mg total) by mouth 4 (four) times daily. 01/14/19   Jacqlyn Larsen, PA-C  citalopram (CELEXA) 20 MG  tablet Take 20 mg by mouth daily.    [provider]  ergocalciferol (VITAMIN D2) 50000 units capsule Take 50,000 Units by mouth once a week.    [provider]  folic acid (FOLVITE) 1 MG tablet Take 1 mg by mouth daily.    [provider]  lactulose (CHRONULAC) 10 GM/15ML solution Take by mouth daily.    [provider]  milk thistle 175 MG tablet Take 175 mg by mouth daily.    [provider]  pantoprazole (PROTONIX) 40 MG tablet Take 40 mg by mouth daily.    [provider]  sildenafil (REVATIO) 20 MG tablet Take 20 mg by mouth 3 (three) times daily.    [provider]  spironolactone (ALDACTONE) 50 MG tablet Take 50 mg by mouth daily.    [provider]  Testosterone 30 MG/ACT SOLN Place 1 Pump onto the skin daily.    [provider]  traZODone (DESYREL) 50 MG tablet Take 50 mg by mouth at bedtime.    [provider]     No family history on file.  Social History   Socioeconomic History   Marital status: Married    Spouse name: Not on file   Number of children: Not on file   Years of education: Not on file   Highest education level: Not on file  Occupational History   Not on file  Tobacco Use   Smoking status: Never   Smokeless tobacco: Current    Types: Chew  Vaping Use   Vaping Use: Never  used  Substance and Sexual Activity   Alcohol use: Not Currently   Drug use: Never   Sexual activity: Not on file  Other Topics Concern   Not on file  Social History Narrative   Not on file   Social Determinants of Health   Financial Resource Strain: Not on file  Food Insecurity: Not on file  Transportation Needs: Not on file  Physical Activity: Not on file  Stress: Not on file  Social Connections: Not on file    ECOG Status: 1 - Symptomatic but completely ambulatory  Review of Systems: A 12 point ROS discussed and pertinent positives are indicated in the HPI above.  All other systems  are negative.  Review of Systems  Vital Signs: There were no vitals taken for this visit.   Physical Exam Constitutional: Oriented to person, place, and time. Well-developed and well-nourished. No distress.   HENT:  Head: Normocephalic and atraumatic.  Eyes: Conjunctivae and EOM are normal. Right eye exhibits no discharge. Left eye exhibits no discharge. No scleral icterus.  Neck: No JVD present.  Pulmonary/Chest: Effort normal. No stridor. No respiratory distress.  Abdomen: soft, non distended Neurological:  alert and oriented to person, place, and time.  Skin: Skin is warm and dry.  not diaphoretic.  Psychiatric:   normal mood and affect.   behavior is normal. Judgment and thought content normal.       Imaging: DUPLEX ULTRASOUND OF LIVER AND TIPS SHUNT  TECHNIQUE: Color and duplex Doppler ultrasound was performed to evaluate the hepatic in-flow and out-flow vessels.  COMPARISON: 10/10/2020  FINDINGS: Portal Vein Velocities  Main: 44 cm/sec  Right: 108 cm/sec  Left: 28 cm/sec  TIPS Stent Velocities  Proximal: 108 cm/sec  Mid: 64  Distal: 4/5 cm/sec  IVC: Present and patent with normal respiratory phasicity.  Hepatic Vein Velocities  Right: 45 cm/sec  Mid: 30 cm/sec  Left: 24 cm/sec  Splenic Vein: 17 cm/sec  Superior Mesenteric Vein: Not visualized  Hepatic Artery: 148 cm/sec  Ascities: Small volume, perihepatic  Varices: None identified  IMPRESSION: 1. Patent TIPS with normal directional flow. 2. Small volume perihepatic ascites.   Electronically Signed By: Lucrezia Europe M.D. On: 02/24/2022 12:29   Labs:  CBC: No results for input(s): "WBC", "HGB", "HCT", "PLT" in the last 8760 hours.  COAGS: No results for input(s): "INR", "APTT" in the last 8760 hours.  BMP: No results for input(s): "NA", "K", "CL", "CO2", "GLUCOSE", "BUN", "CALCIUM", "CREATININE", "GFRNONAA", "GFRAA" in the last 8760 hours.  Invalid input(s): "CMP"  LIVER  FUNCTION TESTS: No results for input(s): "BILITOT", "AST", "ALT", "ALKPHOS", "PROT", "ALBUMIN" in the last 8760 hours.  TUMOR MARKERS: No results for input(s): "AFPTM", "CEA", "CA199", "CHROMGRNA" in the last 8760 hours.  Assessment and Plan:  My impression is that he is doing very well post TIPS creation.  Although we do still see a small amount of perihepatic ascites today, this is clinically asymptomatic and there is no current indication for action.   Will continue to get a yearly follow-up surveillance ultrasound.  He is well aware of the importance of limiting ethanol usage.  He also understands the prophylactic importance of the lactulose.   He knows to call in the interval should he have any questions or problems, or should he notice progressive abdominal distention suggesting worsening ascites.  Thank you for this interesting consult.  I greatly enjoyed meeting Jimmy Sanford and look forward to participating in their care.  A copy of this report was  sent to the requesting provider on this date.  Electronically Signed: Rickard Rhymes 02/24/2022, 12:42 PM   I spent a total of    25 Minutes in face to face in clinical consultation, greater than 50% of which was counseling/coordinating care for cirrhosis and portal venous hypertension, post TIPS creation.

## 2023-04-13 IMAGING — US US ART/VEN ABD/PELV/SCROTUM DOPPLER COMPLETE
1 series · 14 of 25 positions shown · non-contrast
Comparison: 07/05/2019 and previous

CLINICAL DATA: Alcoholic cirrhosis with recurrent abdominal
ascites, status post TIPS creation. Routine follow-up.

EXAM:
DUPLEX ULTRASOUND OF LIVER AND TIPS SHUNT
TECHNIQUE: Color and duplex Doppler ultrasound was performed to evaluate the
hepatic in-flow and out-flow vessels.

[Series 1: us art/ven abd/pelv/scrotum doppler complete · 0.23mm/px · 14 of 39 slices shown]
[im 1/39]
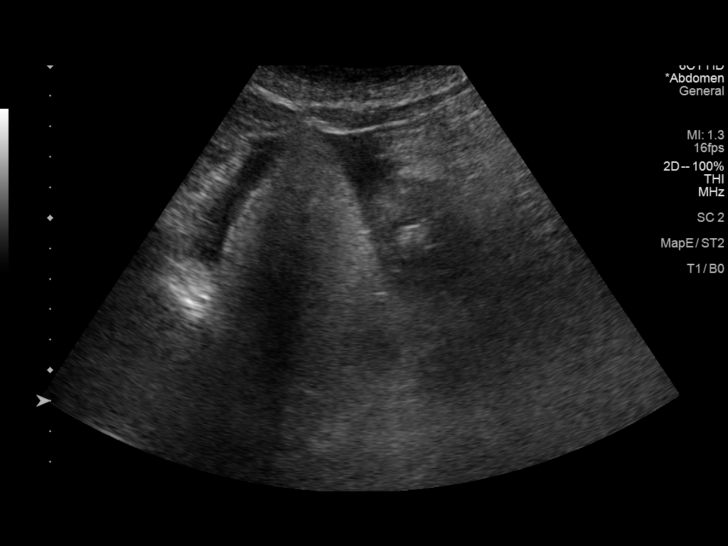
[im 4/39]
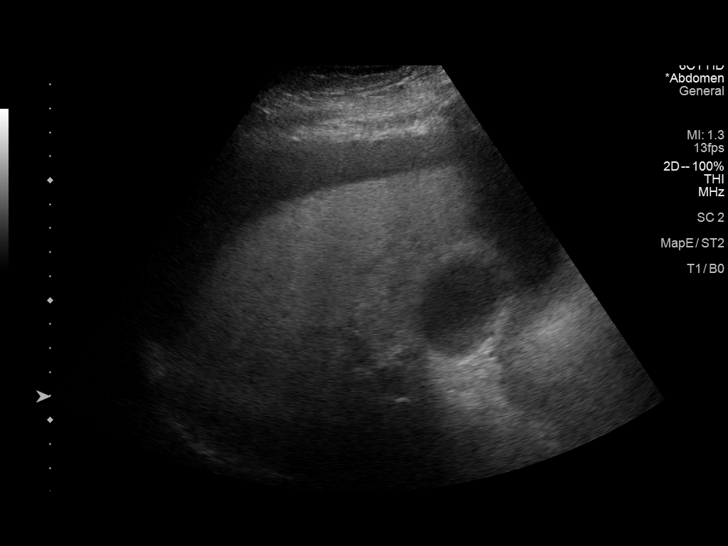
[im 7/39]
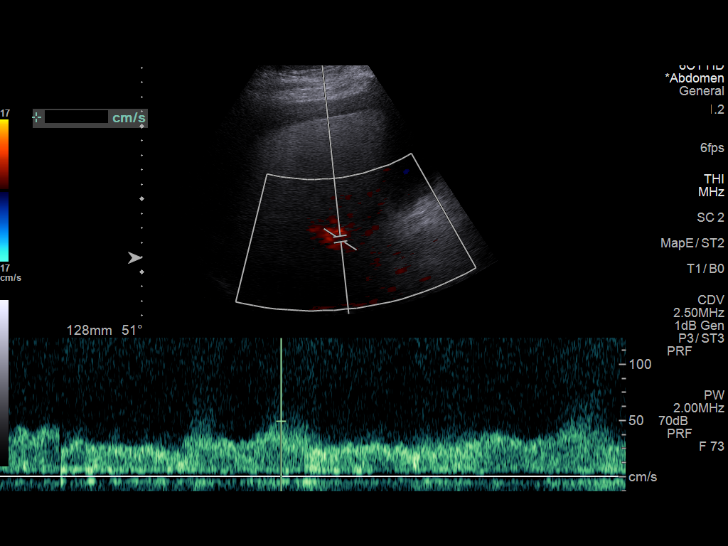
[im 10/39]
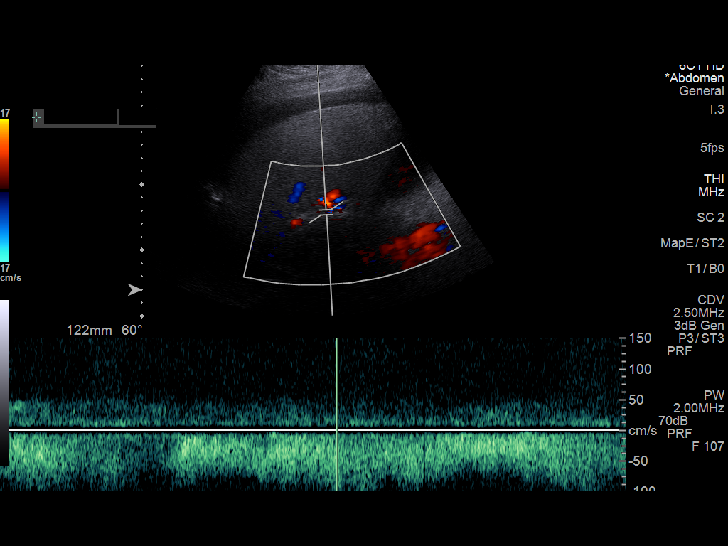
[im 13/39]
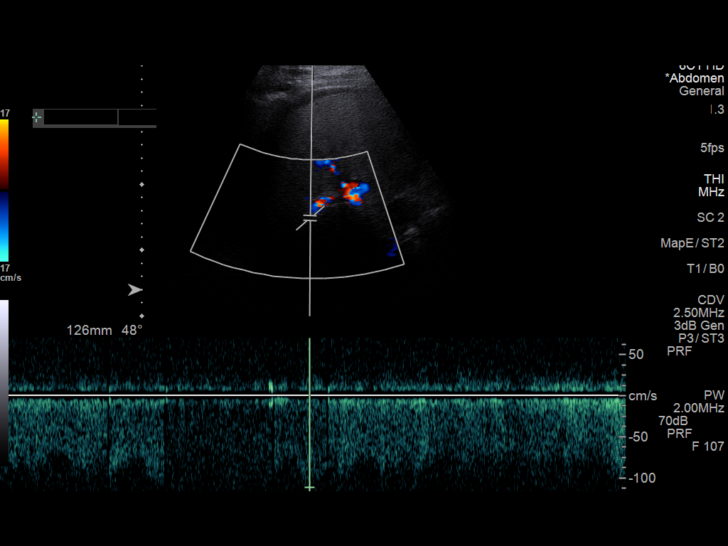
[im 15/39]
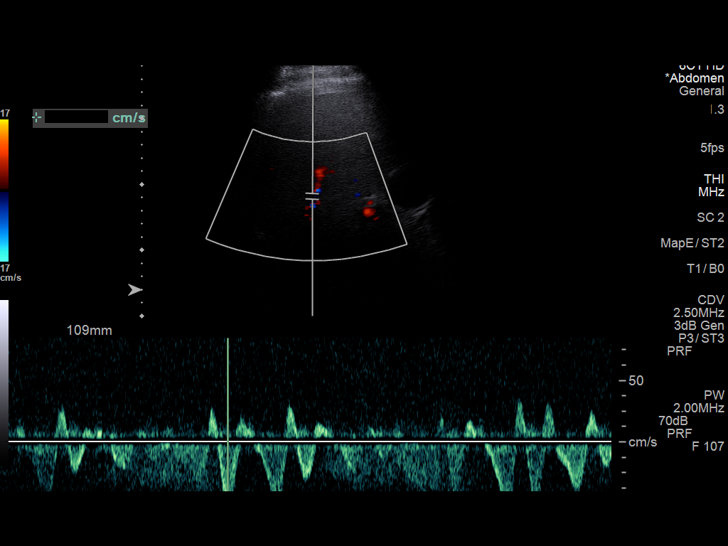
[im 18/39]
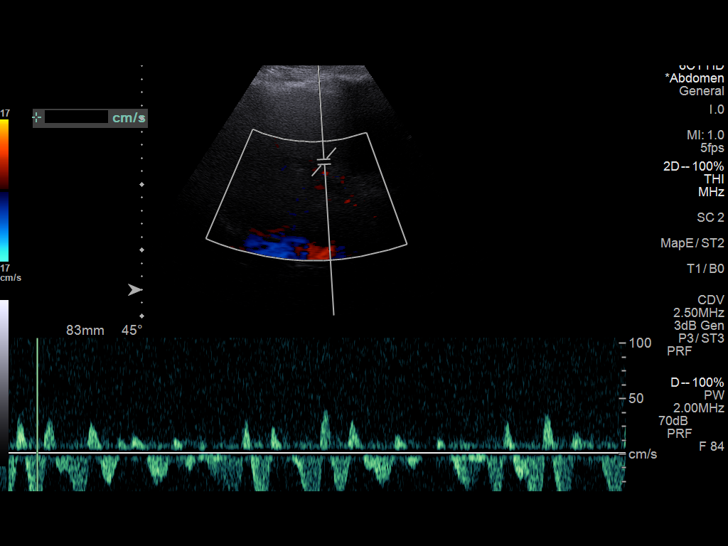
[im 21/39]
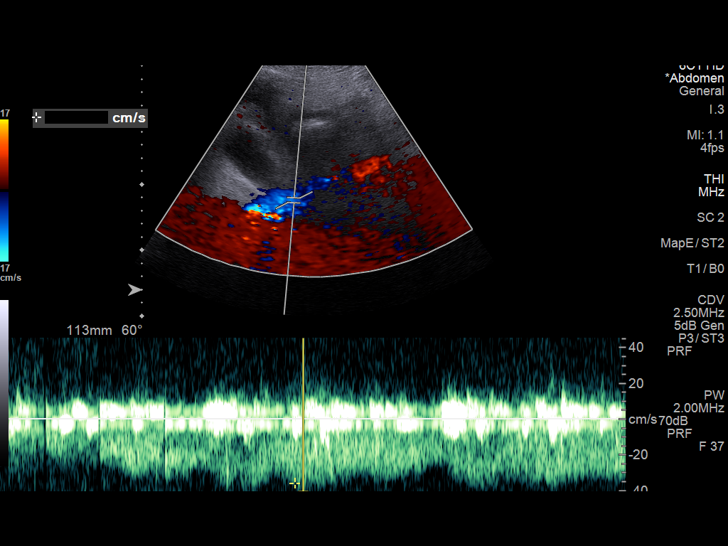
[im 24/39]
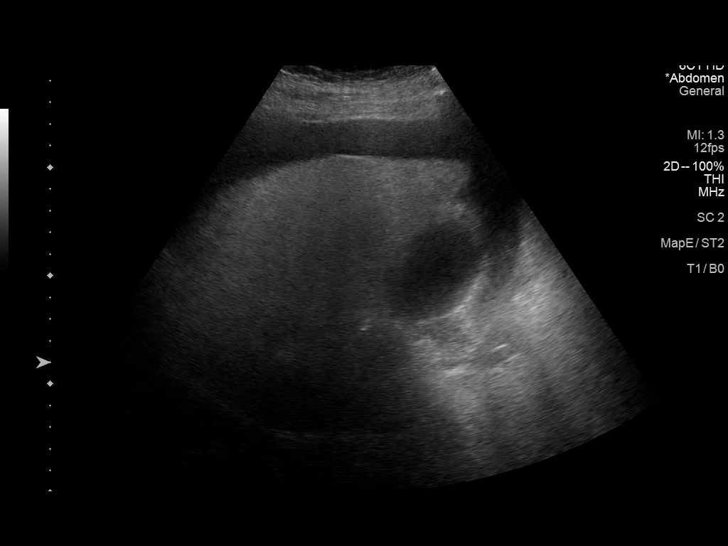
[im 26/39]
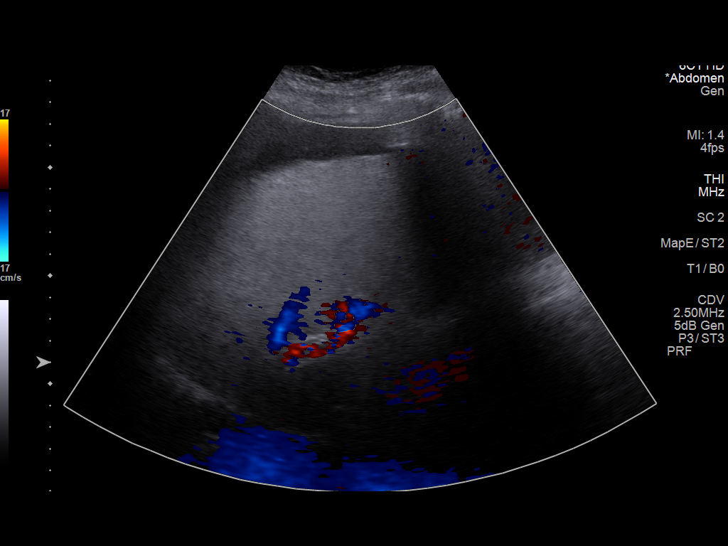
[im 29/39]
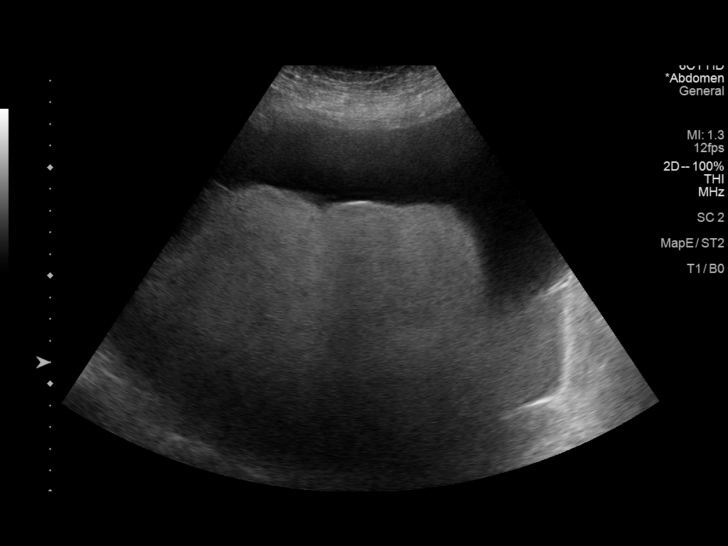
[im 32/39]
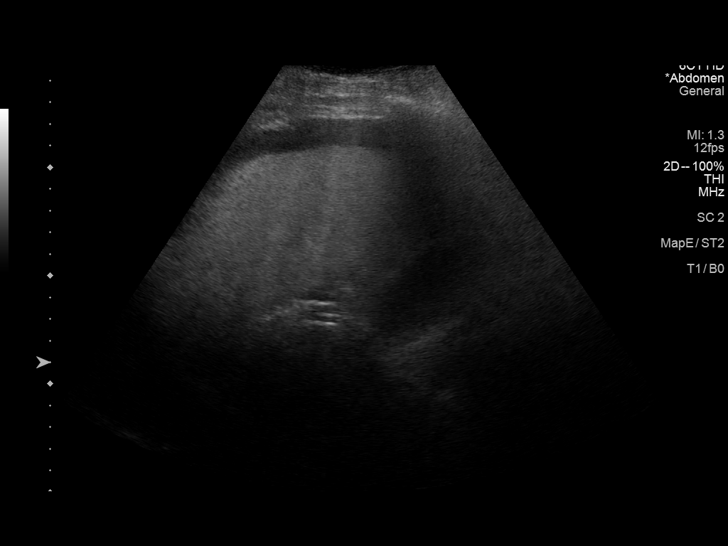
[im 35/39]
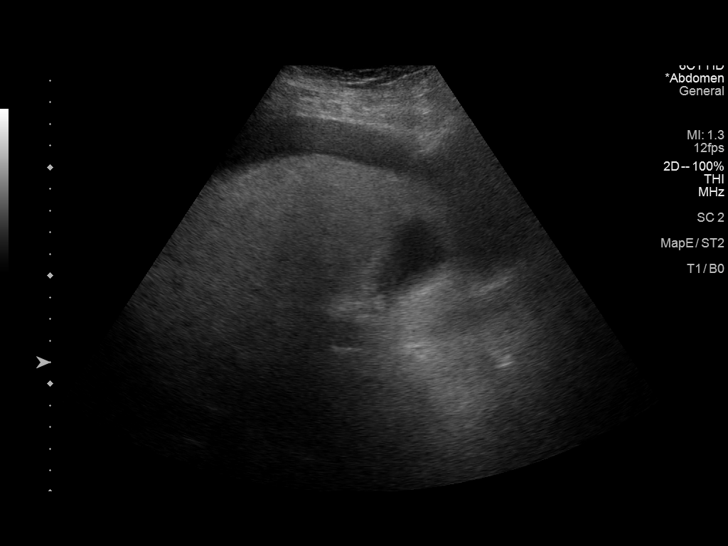
[im 39/39]
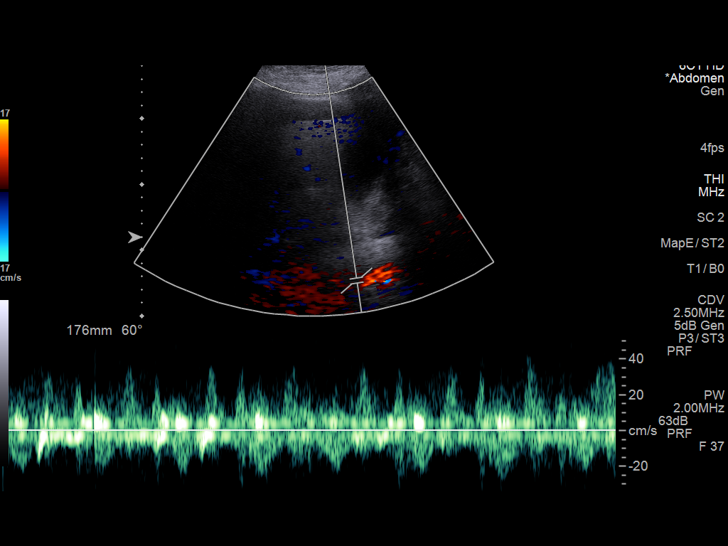

[14 of 25 positions shown; findings below may reference images not displayed]

FINDINGS: Portal Vein Velocities (all hepatopetal)

Main:  49 cm/sec

Right:  39 cm/sec

Left:  11 cm/sec

TIPS Stent Velocities

Proximal:  117 cm/sec

Mid:  119

Distal:  112 cm/sec

IVC: Present and patent with normal respiratory phasicity.

Hepatic Vein Velocities (all hepatofugal)

Right:  27 cm/sec

Mid:  57 cm/sec

Left:  41 cm/sec

Splenic Vein: 41

Superior Mesenteric Vein: 23

Hepatic Artery: 81 cm/sec

Ascities: Small-moderate volume perihepatic

Varices: None identified

Other findings: None
IMPRESSION: 1. Good antegrade flow through the TIPS.
2. Small-moderate perihepatic ascites, new since 04/16/2020.

## 2023-05-08 ENCOUNTER — Other Ambulatory Visit: Payer: Self-pay | Admitting: Interventional Radiology

## 2023-05-08 ENCOUNTER — Encounter: Payer: Self-pay | Admitting: Interventional Radiology

## 2023-05-08 DIAGNOSIS — K7031 Alcoholic cirrhosis of liver with ascites: Secondary | ICD-10-CM

## 2023-12-16 NOTE — Progress Notes (Signed)
 Jimmy Nam, MD [facility] [address] [place], [place]  Assessment:  TD ED Left hydrocele Urinary incontinence Concealed penis  Plan:  Resume bioidentical testosterone cream 20% 4 clicks daily from [facility] Resume sildenafil 5100 mg daily as needed RTC 1 year with labs  Referring Physician:  No referring provider defined for this encounter.  PCP:  Norleen LELON Ferrier, MD  No chief complaint on file.   HPI:  73 y.o. male seen in consultation at the request of No referring provider defined for this encounter.  The patient has had no chief complaint listed for this encounter.  73 year old male who presents for repeat evaluation of TD previously treated with testosterone cream 20% 2 clicks every other day from [facility].  He had excellent improvement in symptoms of hypogonadism.  IPSS 6.  He has some urinary incontinence.  He is on tamsulosin daily.  He has been off of testosterone cream for some time.  He had a severe injury due to falling at a friend's house resulting in hospitalization and prolonged recovery of the large hematoma of his left knee skin abrasions. Positive FH for prostate cancer-father  Testosterone 174 PSA 0.3   No orders to display    LAB RESULTS REVIEWED: PSA levels: Lab Results  Component Value Date   PSA 0.34 12/09/2023   PSA 0.41 10/08/2023   PSA 0.74 05/26/2022   PSA 1.10 08/05/2021   PSA 0.60 09/20/2020   PSA 2.24 12/26/2019   PSA 4.11 (H) 09/02/2019    Serum testosterone levels: Lab Results  Component Value Date   TESTOSTERONE 174 12/09/2023    Urine culture: No results found for: LABURIN  BMP:  Lab Results  Component Value Date   NA 137 11/03/2023   K 4.4 11/03/2023   CALCIUM 9.3 11/03/2023   CL 104 11/03/2023   CO2 28 11/03/2023   BUN 18 11/03/2023   CREATININE 1.23 11/03/2023   PROT 5.8 (L) 10/08/2023   ALBUMIN 3.6 10/08/2023   BILITOT 1.2 (H) 10/08/2023    CBC: Lab Results  Component Value Date   WBC  4.60 12/09/2023   RBC 3.83 (L) 12/09/2023   HGB 11.1 (L) 12/09/2023   HCT 33.3 (L) 12/09/2023   PLT 96 (L) 12/09/2023     PMH:  Medical History[1]   PSH: Surgical History[2]     Medications: Current Medications[3]  Allergies: Patient has no known allergies.   Social History: Patient  reports that he has never smoked. He has never been exposed to tobacco smoke. His smokeless tobacco use includes snuff. He reports that he does not currently use alcohol after a past usage of about 40.0 standard drinks of alcohol per week. He reports that he does not use drugs.   Family History: The patient's family history includes Cirrhosis in his brother; Diabetes in his mother; Heart disease in his mother; Hyperlipidemia in his mother; Hypertension in his mother; Prostate cancer in his father; Stroke in his father.   Review of Systems: General: No fever, chills, weight changes Skin: No rashes, lesions, wounds Eyes: no discharge, redness, pain HENT: no ear pain, hearing loss, drainage, tinnitus Endocrine: no heat/cold intolerance, no polyuria Respiratory: No cough, wheezes, shortness of breath Cardiovascular: No palpitations, chest pain GI: No nausea, vomiting, diarrhea, constipation GU: No dysuria, increased frequency CNS: No numbness, dizziness, headache Musculoskeletal: No back pain, joint pain Blood/lymphatics: No easy bruising, bleeding Mood/affect: No anxiety/depression  Otherwise full 14 point review of systems performed by me is negative  unless mentioned in the HPI    Physical Exam: BP 153/75   Pulse 71   Temp 97.5 F (36.4 C) (Temporal)   Resp 13   Ht 1.772 m (5' 9.75)   BMI 38.73 kg/m  GENERAL APPEARANCE:  Well appearing, well developed, well nourished, NAD HEENT: Atraumatic, Normocephalic NECK: Supple without lymphadenopathy or masses. LUNGS: Clear to auscultation bilaterally, Normal  respiratory effort HEART: no obvious cyanosis ABDOMEN: Soft, non-tender, No  Masses. EXTREMITIES: Moves all extremities well.  Without edema. NEUROLOGIC:  Alert and oriented x 3, CN II-XII grossly intact.  MENTAL STATUS:  Appropriate. BACK:  Non-tender to palpation.  No CVAT SKIN:  Warm, dry and intact.   GENT: Concealed penis.  Descended testes.  Left hydrocele.  Enlarged benign prostate 50 g.  Results:   Urinalysis and laboratory results, if present, reviewed and analyzed. Recent Results (from the past 24 hours)  Urinalysis with Reflex to Microscopic   Collection Time: 12/16/23  3:19 PM  Result Value Ref Range   Color, Urine Yellow Yellow   Clarity, Urine Clear Clear   Specific Gravity, Urine 1.010 1.002 - 1.030   pH, Urine 6.5 5.0 - 8.0   Protein, Urine Negative Negative, Trace mg/dL   Glucose, Urine Negative Negative mg/dL   Ketones, Urine Negative Negative mg/dL   Bilirubin, Urine Negative Negative   Blood, Urine Negative Negative   Nitrite, Urine Negative Negative   Leukocyte Esterase, Urine Trace (A) Negative   Urobilinogen, Urine 0.2 <2.0 mg/dL  Urinalysis, Microscopic Only   Collection Time: 12/16/23  3:19 PM  Result Value Ref Range   WBC, Urine 0-2 <3 /HPF   RBC, Urine None Seen <3 /HPF   Bacteria, Urine None Seen None Seen /HPF            [1] Past Medical History: Diagnosis Date  . Abdominal distension 08/24/2017  . Acute kidney failure 08/24/2017  . Acute pain of right shoulder 12/24/2015  . AKI (acute kidney injury) 09/03/2017  . Alcoholic cirrhosis of liver with ascites    (CMD) 10/16/2017  . Allergic rhinitis 03/02/2014  . Anemia 08/24/2017  . Anemia due to stage 4 chronic kidney disease    (CMD) 10/15/2017  . Anxiety 03/02/2014  . Ascites   . Benign essential hypertension 03/02/2014  . Benign hypertension with CKD (chronic kidney disease) stage IV    (CMD) 03/02/2014  . Bicipital tendinitis of left shoulder 01/30/2016  . Cirrhosis of liver    (CMD)   . CKD (chronic kidney disease), stage IV    (CMD)    Sees Dr. Howell  .  Colon polyp   . Decrease in appetite 08/24/2017  . Depression   . DOE (dyspnea on exertion) 08/24/2017  . Esophageal reflux   . Gastroesophageal reflux disease 03/02/2014  . Gout   . Hearing loss   . Hepatic encephalopathy    (CMD) 10/16/2017  . Hyperkalemia 10/15/2017  . Hypertension   . Impaired fasting glucose 10/10/2015  . Insomnia   . Mild alcohol abuse in early remission 10/10/2015  . Mild alcohol use disorder 10/10/2015  . 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 Other fatigue 08/24/2017  . Other folate deficiency anemias 09/03/2017  . Restless legs syndrome (RLS)   . Shortness of breath on exertion   . Tendinopathy of left rotator cuff 01/30/2016  . Tendinopathy of right rotator cuff 12/24/2015  . Testicular hypofunction 03/02/2014  . Testosterone deficiency   . Umbilical hernia without obstruction and without gangrene 02/26/2018  Added automatically from request for surgery [phone]  . Vitamin D deficiency 10/15/2017  . Vitiligo   [2] Past Surgical History: Procedure Laterality Date  . ANKLE FRACTURE SURGERY Left    Procedure: ANKLE FRACTURE SURGERY  . COLONOSCOPY N/A 09/07/2017   Procedure: COLONOSCOPY;  Surgeon: Shanna Malcom Denmark, MD;  Location: [facility] OR;  Service: Gastroenterology;  Laterality: N/A;  . ESOPHAGOGASTRODUODENOSCOPY  09/07/2017   Procedure: EGD;  Surgeon: Shanna Malcom Denmark, MD;  Location: [facility] OR;  Service: Gastroenterology;;  . LAPAROSCOPIC INCISIONAL / UMBILICAL / VENTRAL HERNIA REPAIR N/A 03/11/2018   Procedure: UMBILICAL HERNIA REPAIR LAPAROSCOPIC W/MESH;  Surgeon: Honor Julianna Leghorn, MD;  Location: [facility] OR;  Service: General;  Laterality: N/A;  . PARACENTESIS     Procedure: PARACENTESIS; multiple  . REPLACEMENT TOTAL KNEE Right    6 weeks ago  . TIPS PROCEDURE  01/20/2018   Procedure: TIPS PROCEDURE  . WISDOM TOOTH EXTRACTION     Procedure: WISDOM TOOTH EXTRACTION  [3] Current Outpatient Medications  Medication Sig Dispense Refill  .  acamprosate (CAMPRAL) 333 mg TbEC Take 333 mg by mouth 3 (three) times a day.    SABRA acetaminophen-codeine (TYLENOL #3) 300-30 mg per tablet Take 1 tablet by mouth every 4 (four) hours as needed for moderate pain (4-6). Take one tablet by mouth every 4 (four) hours as needed for Pain for up to 10 days. Max Daily Amount: 6 tablets, #60 given    . cholecalciferol (VITAMIN D3) 2,000 unit cap capsule TAKE 1 CAPSULE BY MOUTH EVERY DAY 90 capsule 2  . citalopram (CeleXA) 20 mg tablet Take 20 mg by mouth.    . folic acid (FOLVITE) 1 mg tablet Take 1 tablet (1,000 mcg total) by mouth daily. 90 tablet 3  . gabapentin (NEURONTIN) 100 mg capsule Take 1 capsule (100 mg total) by mouth nightly. 30 capsule 11  . gabapentin (NEURONTIN) 300 mg capsule Take 300 mg by mouth 3 (three) times a day. Take one capsule (300 mg dose) by mouth every 8 (eight) hours, #90 tabs given    . lactulose (Enulose) 10 gram/15 mL solution TAKE 15 ML (10 G TOTAL) BY MOUTH 3 (THREE) TIMES A DAY. 240 mL 3  . lidocaine  (LIDODERM ) 5 % patch Apply 2 patches topically daily. Remove & discard patch within 12 hours or as directed by MD. Place two patches onto the skin daily. Remove & Discard patch within 12 hours or as directed by MD, #30 patches given    . magnesium oxide 400 mg (241 mg magnesium) tab Take 1 tablet (400 mg total) by mouth daily. 90 tablet 1  . metoprolol tartrate (LOPRESSOR) 25 mg tablet TAKE 1 TABLET (25 MG TOTAL) BY MOUTH 2 TIMES DAILY. 180 tablet 3  . milk thistle 175 mg tab Take 350 mg by mouth daily.    . mupirocin (BACTROBAN) 2 % cream Thin layer bid 30 g 0  . OLANZapine (ZyPREXA) 5 mg tablet Take 5 mg by mouth at bedtime.    . sennosides-docusate sodium (PERICOLACE) 8.6-50 mg per tablet Take 1 tablet by mouth 2 (two) times a day as needed for constipation. Take one tablet by mouth 2 (two) times a day as needed for Constipation.    . sildenafiL (VIAGRA) 100 mg tablet TAKE 1/2 TO 1 TABLET BY MOUTH DAILY AS NEEDED *DO NOT  TAKE WITH NITRATE MEDICATIONS* 90 tablet 3  . spironolactone (ALDACTONE) 50 mg tablet Take 1 tablet (50 mg total) by mouth 2 (two)  times a day. 180 tablet 3  . tamsulosin (FLOMAX) 0.4 mg cap Take 0.4 mg by mouth daily. Take one capsule (0.4 mg dose) by mouth daily.    . traMADoL (ULTRAM) 50 mg tablet Take 50 mg by mouth every 6 (six) hours as needed for moderate pain (4-6). Take one tablet (50 mg dose) by mouth every 6 (six) hours as needed for up to 10 days. Max Daily Amount: 200 mg, #30 tabs given     No current facility-administered medications for this visit.

## 2024-02-02 NOTE — Progress Notes (Signed)
 Primary care provider: Norleen LELON Ferrier, MD Referring provider:      Norleen LELON Ferrier, MD  Assessment     1. Shortness of breath   2. Excessive daytime sleepiness   3. Snoring   4. Pleural effusion on right   5. Right lower lobe lung mass    73 year old man, non-smoker with history of alcoholic cirrhosis, status post TIPS procedure, hypertension presents after a hospitalization for alcohol withdrawal and possible right lower lobe pneumonia with a right lower lobe mass.  He has a pleural effusion as well that does appear to be loculated and may have been from a prior infection or possible aspiration.  I would like for him to have a thoracentesis done so we can analyze the fluid and he will go ahead and proceed with a PET CT scan as well. The RLL mass is circular and may represent rounded atelectasis as well.   Will see him back in the office after the procedure is completed.  He also has a history of snoring and episodic choking at night and most likely has sleep apnea.    Plan   Thoracentesis RLL , if not able to remove fluid will send for a PET CT as well.   Sleep study and cpap titration if needed (split if meets AASM criteria)  Annual influenza vaccination if no contraindications and pneumonia vaccination per current guidelines.   We discussed the diagnosis and treatment plan in detail and the patient expressed understanding.    Thank you for allowing us  to participate in the care of this patient. Total time spent in this encounter on date of service was 62 minutes and does not include additional procedure time.  Including but not limited to activities such as reviewing patient records, obtaining or reviewing subjective medical history, obtaining or performing a history and physical examination, counseling and/or educating patient/family/caregiver, ordering prescription medication, tests, procedures, imaging, labs, referring to and communicating with other health care providers,  documenting appropriate clinical information in the medical record electronic or other, interpreting results of prescribed tests/procedures, communicating those results to the patient/family/representative and coordinating patient care.     Orders Placed This Encounter  Procedures  . US  Thoracentesis Right  . US  Thoracentesis Right  . PET-CT Skull Base to Thighs Initial FDG  . Spirometry with Lung Volumes (PL), DLCO  . POLYSOMNOGRAM ONLY       Subjective   Chief Complaint  Patient presents with  . New Patient    Referred by Norleen Ferrier for dyspnea  . Dyspnea    PFT completed   . Imaging    CT 01/10/24 and CXR 01/10/24 in the imaging tab  . Immunizations    Patient received flu vaccine at the office     Patient ID:  Jimmy Sanford is a 73 y.o. male, lifelong nonsmoker with obesity, alcoholic cirrhosis, s/p TIPS in 2020, presents after a hospital stay for alcohol withdrawal and a possible RLL pleural effusion with mass. No fever or chills. He does not drink since the last 20 days. His pFt shows restriction moderate today. No asthma , he can walk about 200 feet before he gets out of breath. He can use a bike for 30 min at the gym and does not get out of breath but walking he does. Does not use inhalers. 01/28/24-ultraouns abd does not show ascite, cirrhosis, TIPS seen. He does not have worsening leg swelling.   PULMONARY The patient is not on home oxygen therapy. He  denies a history of blood clots. He denies exposure to asbestos. He denies exposure to tuberculosis.  He denies history of a positive TB skin test.  He denies a history of COPD. He denies a history of asthma. He admits to a recent hospitalization. 3 weeks ago He denies a prior history of lung cancer.  There is no a prior history of lung surgery.  He denies a prior history pneumonia.  Occupational history:  SLEEP He denies a prior history of sleep apnea.He is not using cpap or bipap. He has nonrestorative sleep. The  patient has excessive daytime sleepiness.  He does snore. The snoring is not disruptive. He has witnessed apneas.  He denies choking and gasping while sleeping. The patient has disturbed sleep.  He denies morning headaches. The snoring does not wake the patient up from sleep. He has a history of restless leg syndrome.  The patient has leg movements in his sleep.  He does act out dreams. He does not grind his teeth.   He denies sleep walking.  He does not have nightmares.    He denies sleep paralysis.  Hedenies hallucinations.  The patient denies cataplexy.   He does not work the second or third shift.   He goes to sleep at 12 AM and wakes up at 8:30 AM.  He sleeps for a total of about  8 hours. It take him greater than 30 minutes to fall asleep. He wakes up 1 time(s) during the night. He wakes up due to the following: Unknown.  The patient denies nocturia.  The patient has unexplained arousals from sleep.  He drinks 1 caffeinated beverages a day.    He denies having a prior sleep study.  The patient denies a family member with sleep apnea.   He has not gained weight recently. The patient's neck size is 20 inches.  Epworth Sleep Questionnaire  How likely are you to doze off or fall asleep in the following situations?  Sitting and reading: 2 = moderate chance of dozing Watching TV: 3 = high chance of dozing Sitting inactive in a public place (such as a theater or a meeting):  0 = no chance of dozing As a passenger in a car for an hour without a break: 1 = slight chance of dozing Lying down to rest in the afternoon when circumstances permit: 1 = slight chance of dozing Sitting and talking to someone: 0 = no chance of dozing Sitting quiety after a lunch without alcohol: 1 = slight chance of dozing In a car, while stopped for a few minutes in traffic: 0 = no chance of dozing  Total Epworth: 8   Allergy Triggers None  Occupational Exposures None  Anxiety Screening GAD7 Review        02/02/2024  Generalized Anxiety Disorder 7 item (GAD-7)  1. Feeling nervous, anxious, or on the edge 1  2. Not being able to stop or control worrying 0  3. Worrying too much about different things 1  4. Trouble relaxing 0  5. Being so restless that it's hard to sit still 0  6. Being easily annoyed or irritable 0  7. Feeling afraid as if something awful might happen 1  Total Score 3  Interpretation: Normal     Review of Systems  Constitutional:  Positive for malaise/fatigue.  HENT: Negative.    Eyes: Negative.   Respiratory:  Positive for shortness of breath and wheezing.   Cardiovascular: Negative.   Gastrointestinal: Negative.   Genitourinary:  Negative.   Musculoskeletal:  Positive for back pain, joint pain and neck pain.  Skin: Negative.   Neurological: Negative.   Endo/Heme/Allergies:  Positive for environmental allergies.  Psychiatric/Behavioral:  The patient has insomnia.   All other systems reviewed and are negative.   History   Past Medical History:  Diagnosis Date  . Alcoholic cirrhosis of liver with ascites (*)   . Anemia   . Ascites   . Chronic kidney disease (CKD)   . Cirrhosis (*)   . Gout   . Hearing loss   . Hepatic encephalopathy (*)   . Hyperkalemia   . Hypertension   . Insomnia   . Major depression   . Restless leg    Past Surgical History:  Procedure Laterality Date  . Ankle fracture surgery Left   . Colonoscopy    . Hernia repair    . Paracentesis    . Tips procedure    . Wisdom tooth extraction     Social History[1] Family History  Problem Relation Age of Onset  . COPD Mother   . Stroke Father   . Cirrhosis Brother     Medication History      Medication Sig Dispense Refill  . acetaminophen (TYLENOL) 500 mg tablet Take one tablet (500 mg dose) by mouth every other day.    . citalopram hydrobromide (CELEXA) 20 mg tablet Take one tablet (20 mg dose) by mouth 5 (five) times a week Monday through Friday.    . ergocalciferol  (VITAMIN D2) 50,000 units CAPS capsule Take one capsule (50,000 Units dose) by mouth once a week at 0900. Wednesday    . ferrous sulfate 325 (65 FE) MG tablet Take one tablet (325 mg dose) by mouth 1 (one) time each day before breakfast.    . FLUoxetine (PROZAC) 20 mg capsule Take one capsule (20 mg dose) by mouth every evening.    . folic acid 1 mg tablet Take one tablet (1 mg dose) by mouth every morning.    . gabapentin (NEURONTIN) 300 mg capsule Take one capsule (300 mg dose) by mouth every 8 (eight) hours. 90 capsule 0  . Lactulose 10 g/15 mL solution Take 15 mLs (10 g dose) by mouth 3 (three) times a week.    . metoprolol tartrate (LOPRESSOR) 25 mg tablet Take one tablet (25 mg dose) by mouth 2 (two) times daily.    . Milk Thistle 300 MG CAPS Take 300 mg by mouth daily.    . mupirocin (BACTROBAN) 2 % ointment Apply one Application topically 3 (three) times a day.    SABRA OLANZapine (ZYPREXA) 5 mg tablet Take one tablet (5 mg dose) by mouth at bedtime.    . sennosides-docusate sodium (SENOKOT-S) 8.6-50 mg per tablet Take one tablet by mouth 2 (two) times a day as needed for Constipation. 30 tablet 1  . sildenafil citrate (VIAGRA) 100 mg tablet Take one tablet (100 mg dose) by mouth as needed.    . [Paused] spironolactone (ALDACTONE) 50 mg tablet Take one tablet (50 mg dose) by mouth daily.    SABRA thiamine 100 MG TABS Take one tablet (100 mg dose) by mouth daily. 30 tablet 0   No current facility-administered medications for this visit.        I reviewed the patient's medical,surgical,social and family history. The medications and allergies have been reviewed and updated.   Pulmonary Function Testing/Spirometry  No results found. Radiology   CXR:  No results found for this or any  previous visit.    CT Scan: Results for orders placed during the hospital encounter of 01/10/24  CT Chest WO Contrast  Narrative COMPARISON: None. INDICATION: Shortness of  breath TECHNIQUE:  Impression CT CHEST WO CONTRAST - Contrast: No administration of oral or intravenous contrast. Radiation dose reduction was utilized (automated exposure control, mA or kV adjustment based on patient size, or iterative image reconstruction).  Exam date/time: 01/10/2024 3:51 PM  FINDINGS:  SUPPORT APPARATUS: N.A.  LUNGS/PLEURA: Small loculated right pleural effusion Small left pleural effusion Rounded consolidation the right lower lobe measuring 3.4 x 4.4 cm. Linear density within the right middle lobe measuring 13 mm  HEART/MEDIASTINUM: Cardiac size is normal. Coronary Calcifications: Absent. No acute thoracic aortic abnormalities. No hilar, mediastinal, or axillary lymphadenopathy.  MUSCULOSKELETAL: No acute or destructive osseous processes.  CHRONIC/INCIDENTAL FINDINGS: TIPS catheter. Cirrhotic liver. Calcified location along the splenic capsule, could represent sequelae of prior trauma. Partially calcified right thyroid nodule measuring 3 cm.   IMPRESSION: 1.  Chronic partially likely right pleural effusion. 2.  Rounded consolidation in the right lower lobe, which may represent round atelectasis however a mass is not excluded. Recommend follow-up, correlation with contrasted imaging or PET/CT. 3.  Linear density in the right middle lobe, appearance is most consistent with scarring 4.  Hepatic cirrhosis      Electronically Signed by: Sonny Appl, MD on 01/10/2024 4:48 PM   No results found for this or any previous visit.   No results found for this or any previous visit.   No results found for this or any previous visit.   No results found for this or any previous visit.   No results found for this or any previous visit.   No results found for this or any previous visit.   No results found for this or any previous visit.   VQ Scan: No results found for this or any previous visit.   TTE:   No results found for this or any  previous visit.  Objective  BP 128/84 (BP Location: Left Upper Arm, Patient Position: Sitting)   Pulse 68   Resp 16   Ht 5' 11 (1.803 m)   Wt 288 lb (130.6 kg)   SpO2 98%   BMI 40.17 kg/m   General appearance:  alert, appears stated age, and cooperative Head:    normocephalic Eyes:    pupils are equal, round and reactive Nose:     normal Mouth:    moist, no thrush, mallampati 4 Neck:    supple, no significant adenopathy, no thyromegaly, no JVD Chest:    clear to auscultation, no wheezes, rales or rhonchi, symmetric air entry Heart:    normal rate, regular rhythm, normal S1, S2, no murmurs, rubs, clicks or gallops Abdomen:   soft, nontender, nondistended Neurological:   alert, oriented Extremities:   peripheral pulses normal,no pitting edema , no clubbing or cyanosis  The patient was given a copy of their after visit summary.  Voice-recognition software was used in [employer] of this documentation. Unintended transcription errors may have escaped editorial review.    Immunizations     Name Date Dose VIS Date Route   DTaP / TD / Tdap 10/08/2023  3:47 PM 0.5 mL 06/05/2023 Intramuscular   Site: Left Deltoid   Given By: Aleck JAYSON Furnace, RN   Manufacturer: GlaxoSmithKline   Lot: [id number]   SARS-COV-2 (Covid-19) 04/27/2022 0.5 mL -- Intramuscular   Site: Left Deltoid   Manufacturer: [employer] , [employer]  Lot: [id number]   External: Auto Reconciled From Outside Source   SARS-COV-2 (Covid-19) 11/27/2020 -- -- --   Lot: [id number]   External: Auto Reconciled From Outside Source   SARS-COV-2 (Covid-19) 07/05/2020 -- -- Intramuscular   Site: Left Arm   Manufacturer: Pfizer, [employer]   Lot: [id number]   External: Auto Reconciled From Outside Source   SARS-COV-2 (Covid-19) 08/10/2019 -- -- --   Manufacturer: Pfizer, Inc   Lot: [id number]   External: Auto Reconciled From Outside Source   SARS-COV-2 (Covid-19) 07/20/2019 -- -- --   Manufacturer: Pfizer, Inc   Lot: [id number]   External: Auto Reconciled From  Outside Source         Patient's Medications       * Accurate as of February 02, 2024 10:57 AM. Reflects encounter med changes as of last refresh          PAUSE taking these medications      Instructions  spironolactone 50 mg tablet Wait to take this until your doctor or other care provider tells you to start again. Commonly known as: ALDACTONE  50 mg, Daily       Continued Medications      Instructions  acetaminophen 500 mg tablet Commonly known as: TYLENOL  500 mg, Oral, Every other day   citalopram hydrobromide 20 mg tablet Commonly known as: CELEXA  20 mg, Oral, 5 times weekly (Mon - Fri)   ergocalciferol 50,000 units Caps capsule Commonly known as: Vitamin D2  1 capsule, Oral, Weekly at 0900, Wednesday   ferrous sulfate 325 (65 FE) MG tablet  325 mg, Oral, Daily before breakfast   FLUoxetine 20 mg capsule Commonly known as: PROZAC  20 mg, Oral, Every evening   folic acid 1 mg tablet  1 mg, Oral, Every morning   gabapentin 300 mg capsule Commonly known as: NEURONTIN  300 mg, Oral, Every 8 hours   lactulose 10 g/15 mL solution Commonly known as: LACTULOSE,GENERLAC  10 g, Oral, 3 times weekly   metoprolol tartrate 25 mg tablet Commonly known as: LOPRESSOR  25 mg, Oral, 2 times a day   Milk Thistle 300 MG Caps  300 mg, Oral, Daily   mupirocin 2 % ointment Commonly known as: BACTROBAN  1 Application, 3 times a day   OLANZapine 5 mg tablet Commonly known as: ZYPREXA  5 mg, At bedtime   sennosides-docusate sodium 8.6-50 mg per tablet Commonly known as: SENOKOT-S  1 tablet, Oral, 2 times a day as needed   sildenafil citrate 100 mg tablet Commonly known as: VIAGRA  100 mg, Oral, As needed   Thiamine Mononitrate 100 MG Tabs  100 mg, Oral, Daily                [1] Social History Socioeconomic History  . Marital status: Married  Tobacco Use  . Smoking status: Never  . Smokeless tobacco: Current    Types: Chew  Vaping  Use  . Vaping status: Never Used  Substance and Sexual Activity  . Alcohol use: Yes    Alcohol/week: 4.0 - 14.0 standard drinks of alcohol    Types: 4 - 14 Standard drinks or equivalent per week    Comment: 90 days sober  . Drug use: Yes    Frequency: 3.0 times per week    Types: Marijuana    Comment: smoking  . Sexual activity: Not Currently  *Some images could not be shown.

## 2024-02-05 ENCOUNTER — Ambulatory Visit
Admission: RE | Admit: 2024-02-05 | Discharge: 2024-02-05 | Disposition: A | Discharge status: Home / Self Care | Source: Ambulatory Visit | Attending: Interventional Radiology | Admitting: Interventional Radiology

## 2024-02-05 DIAGNOSIS — K7031 Alcoholic cirrhosis of liver with ascites: Secondary | ICD-10-CM

## 2024-02-05 NOTE — Progress Notes (Signed)
 Patient ID: Jimmy Sanford, male   DOB: Feb 07, 1951, 73 y.o.   MRN: [id number]       Chief Complaint: Patient was consulted remotely today (TeleHealth) for f/u TIPS US   at the request of Holle Sprick.    Referring Physician(s): Badreddine  History of Present Illness: Jimmy Sanford is a 73 y.o. male s/p TIPS creation 01/2018 to control recurrent ascites. Great clinical success with no recurrent ascites since 2019, no need for paracentesis. US  01/28/2024 shows persistent TIPS patency with good velocities throughout, no ascites. No evidence of stenosis, occlusion, or venous thrombosis.  He was noted recently to have a right pleural effusion with adjacent probable round atelectasis, and he is scheduled for thoracentesis for further assessment.   Past Medical History:  Diagnosis Date   Anxiety    Cirrhosis (HCC)     Past Surgical History:  Procedure Laterality Date   IR RADIOLOGIST EVAL & MGMT  12/17/2017   IR RADIOLOGIST EVAL & MGMT  02/18/2018   IR RADIOLOGIST EVAL & MGMT  07/05/2019   IR RADIOLOGIST EVAL & MGMT  10/10/2020   IR RADIOLOGIST EVAL & MGMT  02/24/2022    Allergies: Patient has no known allergies.  Medications: Prior to Admission medications   Medication Sig Start Date End Date Taking? Authorizing Provider  amoxicillin -clavulanate (AUGMENTIN ) 875-125 MG tablet Take 1 tablet by mouth every 12 (twelve) hours. 11/21/20   Griselda Norris, MD  cephALEXin  (KEFLEX ) 500 MG capsule Take 1 capsule (500 mg total) by mouth 4 (four) times daily. 01/14/19   Alva Larraine FALCON, PA-C  citalopram (CELEXA) 20 MG tablet Take 20 mg by mouth daily.    [provider]  ergocalciferol (VITAMIN D2) 50000 units capsule Take 50,000 Units by mouth once a week.    [provider]  folic acid (FOLVITE) 1 MG tablet Take 1 mg by mouth daily.    [provider]  lactulose (CHRONULAC) 10 GM/15ML solution Take by mouth daily.    [provider]  milk thistle 175 MG tablet Take 175 mg  by mouth daily.    [provider]  pantoprazole (PROTONIX) 40 MG tablet Take 40 mg by mouth daily.    [provider]  sildenafil (REVATIO) 20 MG tablet Take 20 mg by mouth 3 (three) times daily.    [provider]  spironolactone (ALDACTONE) 50 MG tablet Take 50 mg by mouth daily.    [provider]  Testosterone 30 MG/ACT SOLN Place 1 Pump onto the skin daily.    [provider]  traZODone (DESYREL) 50 MG tablet Take 50 mg by mouth at bedtime.    [provider]     No family history on file.  Social History   Socioeconomic History   Marital status: Married    Spouse name: Not on file   Number of children: Not on file   Years of education: Not on file   Highest education level: Not on file  Occupational History   Not on file  Tobacco Use   Smoking status: Never   Smokeless tobacco: Current    Types: Chew  Vaping Use   Vaping status: Never Used  Substance and Sexual Activity   Alcohol use: Not Currently   Drug use: Never   Sexual activity: Not on file  Other Topics Concern   Not on file  Social History Narrative   Not on file   Social Drivers of Health   Financial Resource Strain: Not on file  Food Insecurity:  No Food Insecurity (01/11/2024)   Received from [facility]   Hunger Vital Sign    Within the past 12 months, you worried that your food would run out before you got the money to buy more.: Never true    Within the past 12 months, the food you bought just didn't last and you didn't have money to get more.: Never true  Transportation Needs: No Transportation Needs (01/13/2024)   Received from [facility] - Transportation    In the past 12 months, has lack of transportation kept you from medical appointments or from getting medications?: No    In the past 12 months, has lack of transportation kept you from meetings, work, or from getting things needed for daily living?: No  Physical Activity: Not on  file  Stress: No Stress Concern Present (01/11/2024)   Received from [facility] - Occupational Stress Questionnaire    Do you feel stress - tense, restless, nervous, or anxious, or unable to sleep at night because your mind is troubled all the time - these days?: Not at all  Social Connections: Unknown (09/17/2021)   [facility]   Social Network    Social Network: Not on file    ECOG Status: 1 - Symptomatic but completely ambulatory  Review of Systems  Review of Systems: A 12 point ROS discussed and pertinent positives are indicated in the HPI above.  All other systems are negative.   Physical Exam No direct physical exam was performed (except for noted visual exam findings with Video Visits).     Vital Signs: There were no vitals taken for this visit.  Imaging: US  ABDOMEN LIMITED W DOPPLER, 01/28/2024 2:07 PM   INDICATION: cirrhosis s/p TIPS, HCC screening and TIPS patency assessment, Alcoholic cirrhosis of liver without ascites    (CMD) \ K70.30 Alcoholic cirrhosis of liver without ascites    (CMD)  cirrhosis s/p TIPS, HCC screening and TIPS patency assessment  COMPARISON: None.   TECHNIQUE: Multi-planar real-time grayscale ultrasound imaging and duplex assessment with color and spectral Doppler analysis of the liver, spleen, and associated major vessels was performed.   FINDINGS:   .  Aorta: No abdominal aortic aneurysm.  .  IVC: The inferior vena cava is patent. Velocity 50 cm/s.  .  Liver: Length = 14.6 cm in craniocaudal dimension. Fatty change, nodular contour and coarse echotexture   .  TIPS: Connection from Main portal vein to Middle hepatic vein. Patent. Antegrade flow.      .  Stent velocities:      .  Portal end: 57 cm/s.      .  Mid: 110 cm/s.      SABRA  Hepatic vein end: 71 cm/s.   SABRA  Portal Veins:     .  Portosplenic Confluence: Patent.     .  Main Portal Vein: Patent. Antegrade flow. Velocity: 33  cm/s.     .  Right Portal Vein: Patent. Antegrade flow. Velocity: 42 cm/s.     SABRA  Left Portal Vein: Patent. Retrograde flow. Velocity: 20 cm/s.  SABRA  Hepatic Artery: Patent with antegrade flow. Velocity 100 cm/s.  SABRA  Hepatic Veins:     .  Left Hepatic Vein: Patent. Antegrade flow. Velocity: 28 cm/s.     .  Middle Hepatic Vein: Patent. Antegrade flow. Velocity: 31 cm/s.     .  Right Hepatic Vein: Patent. Antegrade flow. Velocity: 49 cm/s.  SABRA  Spleen: Maximal dimension = 21.7 cm. Splenomegaly without focal lesions.  SABRA  Splenic Artery: Patent. Velocity: 101 cm/s.  SABRA  Splenic Vein: Patent with antegrade flow. Velocity: 57 cm/s.  .  Collateral vessels: None visualized.  .  Ascites: None visualized.  .  Additional comments: Right pleural effusion   Labs:  CBC: No results for input(s): WBC, HGB, HCT, PLT in the last 8760 hours.  COAGS: No results for input(s): INR, APTT in the last 8760 hours.  BMP: No results for input(s): NA, K, CL, CO2, GLUCOSE, BUN, CALCIUM, CREATININE, GFRNONAA, GFRAA in the last 8760 hours.  Invalid input(s): CMP  LIVER FUNCTION TESTS: No results for input(s): BILITOT, AST, ALT, ALKPHOS, PROT, ALBUMIN in the last 8760 hours.  TUMOR MARKERS: No results for input(s): AFPTM, CEA, CA199, CHROMGRNA in the last 8760 hours.  Assessment and Plan:  My impression is that he is doing very well post TIPS creation.  No interval ascites or other complication.   Will continue to get a yearly follow-up surveillance ultrasound.  He is well aware of the importance of limiting ethanol usage.  He also understands the prophylactic importance of the lactulose.   He knows to call in the interval should he have any questions or problems, or should he notice progressive abdominal distention suggesting worsening ascites.   Thank you for this interesting consult.  I greatly enjoyed meeting Jimmy Sanford and look forward to participating  in their care.  A copy of this report was sent to the requesting provider on this date.  Electronically Signed: Dayne Kaileb Monsanto 02/05/2024, 10:50 AM   I spent a total of    25 Minutes in remote  clinical consultation, greater than 50% of which was counseling/coordinating care for TIPS.    Visit type: Audio only (telephone). Audio (no video) only due to MD missed appt window. Alternative for in-person consultation at [facility], [address] [place], [place], [place].  This format is felt to be most appropriate for this patient at this time.  All issues noted in this document were discussed and addressed.
